# Patient Record
Sex: Female | Born: 1940 | Race: Black or African American | Hispanic: No | Marital: Married | Smoking: Former smoker
Health system: Southern US, Community
[De-identification: ages and names within clinical notes are randomized; demographics above are authoritative.]

## PROBLEM LIST (undated history)

## (undated) DIAGNOSIS — I10 Essential (primary) hypertension: Secondary | ICD-10-CM

## (undated) HISTORY — DX: Essential (primary) hypertension: I10

## (undated) HISTORY — PX: TONSILECTOMY, ADENOIDECTOMY, BILATERAL MYRINGOTOMY AND TUBES: SHX2538

## (undated) HISTORY — PX: ABDOMINAL HYSTERECTOMY: SHX81

---

## 1998-04-21 ENCOUNTER — Ambulatory Visit (HOSPITAL_COMMUNITY): Admission: RE | Admit: 1998-04-21 | Discharge: 1998-04-21 | Payer: Self-pay | Admitting: Internal Medicine

## 1999-07-08 ENCOUNTER — Other Ambulatory Visit: Admission: RE | Admit: 1999-07-08 | Discharge: 1999-07-08 | Payer: Self-pay | Admitting: Obstetrics and Gynecology

## 2000-04-06 ENCOUNTER — Ambulatory Visit (HOSPITAL_COMMUNITY): Admission: RE | Admit: 2000-04-06 | Discharge: 2000-04-06 | Payer: Self-pay | Admitting: Internal Medicine

## 2000-11-02 ENCOUNTER — Other Ambulatory Visit: Admission: RE | Admit: 2000-11-02 | Discharge: 2000-11-02 | Payer: Self-pay | Admitting: Obstetrics and Gynecology

## 2001-10-13 ENCOUNTER — Encounter: Payer: Self-pay | Admitting: Obstetrics and Gynecology

## 2001-10-14 ENCOUNTER — Inpatient Hospital Stay (HOSPITAL_COMMUNITY): Admission: RE | Admit: 2001-10-14 | Discharge: 2001-10-15 | Payer: Self-pay | Admitting: Obstetrics and Gynecology

## 2002-05-10 ENCOUNTER — Encounter: Admission: RE | Admit: 2002-05-10 | Discharge: 2002-08-08 | Payer: Self-pay | Admitting: Internal Medicine

## 2003-11-23 HISTORY — PX: LAPAROSCOPIC BILATERAL SALPINGO OOPHERECTOMY: SHX5890

## 2005-04-30 ENCOUNTER — Ambulatory Visit (HOSPITAL_COMMUNITY): Admission: RE | Admit: 2005-04-30 | Discharge: 2005-04-30 | Payer: Self-pay | Admitting: Gastroenterology

## 2006-09-05 ENCOUNTER — Encounter: Admission: RE | Admit: 2006-09-05 | Discharge: 2006-09-05 | Payer: Self-pay | Admitting: Internal Medicine

## 2010-12-13 ENCOUNTER — Encounter: Payer: Self-pay | Admitting: Internal Medicine

## 2013-08-01 ENCOUNTER — Ambulatory Visit (INDEPENDENT_AMBULATORY_CARE_PROVIDER_SITE_OTHER): Payer: Medicare Other | Admitting: Physician Assistant

## 2013-08-01 ENCOUNTER — Encounter: Payer: Self-pay | Admitting: Physician Assistant

## 2013-08-01 VITALS — BP 156/72 | HR 86 | Wt 159.0 lb

## 2013-08-01 DIAGNOSIS — G4700 Insomnia, unspecified: Secondary | ICD-10-CM

## 2013-08-01 DIAGNOSIS — R05 Cough: Secondary | ICD-10-CM

## 2013-08-01 DIAGNOSIS — Z13220 Encounter for screening for lipoid disorders: Secondary | ICD-10-CM

## 2013-08-01 DIAGNOSIS — I10 Essential (primary) hypertension: Secondary | ICD-10-CM

## 2013-08-01 DIAGNOSIS — Z23 Encounter for immunization: Secondary | ICD-10-CM

## 2013-08-01 DIAGNOSIS — Z131 Encounter for screening for diabetes mellitus: Secondary | ICD-10-CM

## 2013-08-01 MED ORDER — LOSARTAN POTASSIUM-HCTZ 100-25 MG PO TABS: 1.0000 | ORAL_TABLET | Freq: Every day | ORAL | Status: DC

## 2013-08-01 MED ORDER — AMLODIPINE BESYLATE 10 MG PO TABS: 10.0000 mg | ORAL_TABLET | Freq: Every day | ORAL | Status: DC

## 2013-08-01 NOTE — Progress Notes (Signed)
Subjective:    Patient ID: Shirley James, female    DOB: February 16, 1941, 72 y.o.   MRN: [id number]  HPI Patient is a 72 year old female who presents to the clinic to establish care. Past medical history positive for hypertension. She has been told in the past that she has diabetes but she is checked her sugar over the past year and reports that fasting has been running in the 90s. She would like clarification today. She is a former smoker that stopped in 1963. She exercises regularly in the form of walking. She has had a hysterectomy and nephrectomy.  Hypertension-blood pressure has been previously control. She has been out of Diovan for the last week. She would like to try something cheaper there is an option to. Patient was switched to Diovan after she developed a cough with an ACE inhibitor. Patient reports that cough has not gone away since. She denies any wheezing or shortness of breath. Cough is mostly present at night. She does not wake up short of breath or have to sleep on multiple pillows. Patient wonders if anything can be done for her cough. She denies any fever, chills, nausea, vomiting.  Patient is also having problems waking up at night and not been noted to sleep. She has no problems going to sleep but does wake up about 3:00 in the morning and not been able to go back to sleep. She is able to stay awake during the day and then usually sleeps very well the next night. She denies any significant snoring. She has not tried anything to make better.  Patient was told at one point she had diabetes. She would like rechecked. She has had her pneumonia shot in 2008. Mammogram is up-to-date as far as this year. She does have a left breast cyst that has been on.      Review of Systems  All other systems reviewed and are negative.       Objective:   Physical Exam  Constitutional: She is oriented to person, place, and time. She appears well-developed and well-nourished.  HENT:  Head:  Normocephalic and atraumatic.  Right Ear: External ear normal.  Left Ear: External ear normal.  Nose: Nose normal.  Mouth/Throat: Oropharynx is clear and moist.  Eyes: Conjunctivae are normal.  Neck: Normal range of motion. Neck supple.  Cardiovascular: Normal rate, regular rhythm and normal heart sounds.   Pulmonary/Chest: Effort normal and breath sounds normal. She has no wheezes.  Neurological: She is alert and oriented to person, place, and time.  Skin: Skin is warm and dry.  Psychiatric: She has a normal mood and affect. Her behavior is normal.          Assessment & Plan:  Hypertension-to change Diovan HCT to Hyzaar to see if cheaper work as well. Patient's BP was elevated but has not been on Diovan for last week. We'll recheck blood pressure in one to 2 months. Continue exercising and keeping a low salt diet. Continue on Norvasc.  Cough-caution to be present with ARB. There potentially could be an acid reflux calls or post nasal drip. Discussed with patient to try Flonase and Zyrtec daily. She could also consider a decongestant for the next couple of days. If not improving she should try Prilosec OTC to see if there could be some acid reflux component. If still having coughing if she is followup in office so we could consider inhalers or peak flows.   Screening for diabetes-we'll get fasting glucose. If elevated then  come followup with A1c.  Insomnia-discuss trying melatonin and good bedtime routine. Followup as needed.  Screening for cholesterol was also ordered.  Patient was given immunization for the flu.

## 2013-08-01 NOTE — Patient Instructions (Addendum)
Beta glucan 1 capsule daily. Zyrtec daily.  Then if not improved try Prilosec OTC.   Then call office.   Melatonin 3mg  to 10mg .

## 2013-08-03 DIAGNOSIS — G4700 Insomnia, unspecified: Secondary | ICD-10-CM | POA: Insufficient documentation

## 2013-08-03 DIAGNOSIS — I10 Essential (primary) hypertension: Secondary | ICD-10-CM | POA: Insufficient documentation

## 2013-08-03 LAB — LIPID PANEL
Cholesterol: 196 mg/dL (ref 0–200)
HDL: 68 mg/dL (ref >39)
LDL Cholesterol: 117 mg/dL — ABNORMAL HIGH (ref 0–99)
Total CHOL/HDL Ratio: 2.9 Ratio
Triglycerides: 56 mg/dL (ref <150)
VLDL: 11 mg/dL (ref 0–40)

## 2013-08-03 LAB — COMPLETE METABOLIC PANEL WITH GFR
ALT: 12 U/L (ref 0–35)
AST: 15 U/L (ref 0–37)
Albumin: 4.1 g/dL (ref 3.5–5.2)
Alkaline Phosphatase: 73 U/L (ref 39–117)
BUN: 16 mg/dL (ref 6–23)
CO2: 30 mEq/L (ref 19–32)
Calcium: 9 mg/dL (ref 8.4–10.5)
Chloride: 103 mEq/L (ref 96–112)
Creat: 0.83 mg/dL (ref 0.50–1.10)
GFR, Est African American: 82 mL/min
GFR, Est Non African American: 71 mL/min
Glucose, Bld: 97 mg/dL (ref 70–99)
Potassium: 3.8 mEq/L (ref 3.5–5.3)
Sodium: 140 mEq/L (ref 135–145)
Total Bilirubin: 0.5 mg/dL (ref 0.3–1.2)
Total Protein: 7.2 g/dL (ref 6.0–8.3)

## 2013-10-31 ENCOUNTER — Ambulatory Visit: Payer: Self-pay | Admitting: Physician Assistant

## 2013-11-07 ENCOUNTER — Ambulatory Visit (INDEPENDENT_AMBULATORY_CARE_PROVIDER_SITE_OTHER): Payer: Medicare Other | Admitting: Physician Assistant

## 2013-11-07 ENCOUNTER — Encounter: Payer: Self-pay | Admitting: Physician Assistant

## 2013-11-07 VITALS — BP 131/67 | HR 76 | Wt 161.0 lb

## 2013-11-07 DIAGNOSIS — I10 Essential (primary) hypertension: Secondary | ICD-10-CM

## 2013-11-07 DIAGNOSIS — G4700 Insomnia, unspecified: Secondary | ICD-10-CM

## 2013-11-07 DIAGNOSIS — R0982 Postnasal drip: Secondary | ICD-10-CM

## 2013-11-07 MED ORDER — AMLODIPINE BESYLATE 10 MG PO TABS: 10.0000 mg | ORAL_TABLET | Freq: Every day | ORAL | Status: DC

## 2013-11-07 MED ORDER — LOSARTAN POTASSIUM-HCTZ 100-25 MG PO TABS: 1.0000 | ORAL_TABLET | Freq: Every day | ORAL | Status: DC

## 2013-11-07 NOTE — Progress Notes (Signed)
Subjective:    Patient ID: Shirley James, female    DOB: June 09, 1941, 72 y.o.   MRN: [id number]  HPI Pt presents to the clinic to get medication refills.   HTN- taking medication regularly. Denies any CP, palpitations, headaches, vision changes. Feels great. Exercises regularly. Keeping a low salt diet.   Insomnia- ongoing never tried melatonin.   PND- cough is much better with zyrtec. No complaints.    Review of Systems     Objective:   Physical Exam  Constitutional: She is oriented to person, place, and time. She appears well-developed and well-nourished.  HENT:  Head: Normocephalic and atraumatic.  Cardiovascular: Normal rate, regular rhythm and normal heart sounds.   Pulmonary/Chest: Effort normal and breath sounds normal. She has no wheezes.  Neurological: She is alert and oriented to person, place, and time.  Skin: Skin is warm and dry.  Psychiatric: She has a normal mood and affect. Her behavior is normal.          Assessment & Plan:  HTN-BP looks good today. Refilled for 6 months. Will get Bloodwork in 6 months.   Insomnia- Not tried melatonin. Discussed 3mg  up to 10mg  1 hour before bedtime. Follow up as needed.    PND- controlled with zyrtec as needed. Discussed with pt sudafed for a couple of days if PND worsening could also help.

## 2014-05-08 ENCOUNTER — Ambulatory Visit: Payer: Self-pay | Admitting: Physician Assistant

## 2014-05-13 ENCOUNTER — Encounter: Payer: Self-pay | Admitting: Physician Assistant

## 2014-05-13 ENCOUNTER — Ambulatory Visit (INDEPENDENT_AMBULATORY_CARE_PROVIDER_SITE_OTHER): Payer: Medicare Other | Admitting: Physician Assistant

## 2014-05-13 VITALS — BP 122/68 | HR 76 | Wt 158.0 lb

## 2014-05-13 DIAGNOSIS — R011 Cardiac murmur, unspecified: Secondary | ICD-10-CM

## 2014-05-13 DIAGNOSIS — Z23 Encounter for immunization: Secondary | ICD-10-CM

## 2014-05-13 DIAGNOSIS — I10 Essential (primary) hypertension: Secondary | ICD-10-CM

## 2014-05-13 MED ORDER — AMLODIPINE BESYLATE 10 MG PO TABS: 10.0000 mg | ORAL_TABLET | Freq: Every day | ORAL | Status: DC

## 2014-05-13 MED ORDER — LOSARTAN POTASSIUM-HCTZ 100-25 MG PO TABS: 1.0000 | ORAL_TABLET | Freq: Every day | ORAL | Status: DC

## 2014-05-13 NOTE — Patient Instructions (Signed)

## 2014-05-13 NOTE — Progress Notes (Addendum)
Subjective:    Patient ID: Shirley James, female    DOB: 02/06/1941, 73 y.o.   MRN: [id number]  HPI Pt is a 73 yo female who presents to the clinic for 6 month follow up on HTN. Doing great. No concerns or complaints. Denies any HA's, vision changes, CP or palpitations. Exercises on treadmill at home daily. Feels great.    Review of Systems  All other systems reviewed and are negative.      Objective:   Physical Exam  Constitutional: She is oriented to person, place, and time. She appears well-developed and well-nourished.  HENT:  Head: Normocephalic and atraumatic.  Cardiovascular: Normal rate and regular rhythm.   2/6 systolic murmur-ongoing hx of.   Pulmonary/Chest: Effort normal and breath sounds normal. She has no wheezes.  Neurological: She is alert and oriented to person, place, and time.  Skin: Skin is dry.  Psychiatric: She has a normal mood and affect. Her behavior is normal.          Assessment & Plan:  HTN- refilled norvasc and hyzaar for 6 months.   Heart murmur- hx of. Discussed with pt need for echo to reevaluate in next year. Discussed if dizziness, weakness, SOB etc come in for follow up.   prevnar 13 given today. Will follow up with pneumococcal 23 in one year.   Last labs 07/2013. Will wait until December to get fastings labs.   Will call and try to get copy of last colonsocopy to determine when she is due for next one.

## 2014-07-30 ENCOUNTER — Encounter: Payer: Self-pay | Admitting: Physician Assistant

## 2014-11-05 ENCOUNTER — Encounter: Payer: Self-pay | Admitting: Physician Assistant

## 2014-11-05 ENCOUNTER — Ambulatory Visit (INDEPENDENT_AMBULATORY_CARE_PROVIDER_SITE_OTHER): Payer: Medicare Other | Admitting: Physician Assistant

## 2014-11-05 VITALS — BP 127/60 | HR 80 | Wt 159.0 lb

## 2014-11-05 DIAGNOSIS — G5601 Carpal tunnel syndrome, right upper limb: Secondary | ICD-10-CM

## 2014-11-05 DIAGNOSIS — R011 Cardiac murmur, unspecified: Secondary | ICD-10-CM

## 2014-11-05 DIAGNOSIS — Z23 Encounter for immunization: Secondary | ICD-10-CM

## 2014-11-05 DIAGNOSIS — I10 Essential (primary) hypertension: Secondary | ICD-10-CM

## 2014-11-05 MED ORDER — AMLODIPINE BESYLATE 10 MG PO TABS: 10.0000 mg | ORAL_TABLET | Freq: Every day | ORAL | Status: DC

## 2014-11-05 MED ORDER — LOSARTAN POTASSIUM-HCTZ 100-25 MG PO TABS: 1.0000 | ORAL_TABLET | Freq: Every day | ORAL | Status: DC

## 2014-11-05 NOTE — Progress Notes (Signed)
Subjective:    Patient ID: Shirley James, female    DOB: 11-04-1941, 73 y.o.   MRN: [id number]  HPI Pt presents to the clinic for medication refill.   HTN- doing well. No concerns. No CP, palpitations, SOB, headaches. Taking medication daily.   Pt does reports some right sided hand and wrist numbness and tingling at night and in the morning. Not every night just usually once or twice a month. Goes away once been up a little bit.    Review of Systems  All other systems reviewed and are negative.      Objective:   Physical Exam  Constitutional: She is oriented to person, place, and time. She appears well-developed and well-nourished.  HENT:  Head: Normocephalic and atraumatic.  Cardiovascular: Normal rate and regular rhythm.   Murmur heard. SEM 4/6  Pulmonary/Chest: Effort normal and breath sounds normal. She has no wheezes.  Musculoskeletal:  Normal ROm or right wrist.  Negative phalens and tinels.  No tendernss to palpation over right hand.  No thenar emience atropy.   Neurological: She is alert and oriented to person, place, and time.  Skin: Skin is dry.  Psychiatric: She has a normal mood and affect. Her behavior is normal.          Assessment & Plan:  HTN- refilled for 9 months.   Needs CPE.   Systolic heart murmur- unchanged, not symptomatic. Needs follow up echo pt reports will get next year.   Right wrist carpel tunnel syndrome- reassured pt sounds like carpel tunnel. Symptoms not very consistent. Suggested night brace at [facility] and ibuprofen as needed. If symptoms occuring more often follow up for more treatment.

## 2015-03-21 ENCOUNTER — Ambulatory Visit (INDEPENDENT_AMBULATORY_CARE_PROVIDER_SITE_OTHER): Payer: Self-pay | Admitting: Physician Assistant

## 2015-03-21 ENCOUNTER — Encounter: Payer: Self-pay | Admitting: Physician Assistant

## 2015-03-21 VITALS — BP 145/67 | HR 93 | Wt 161.0 lb

## 2015-03-21 DIAGNOSIS — E559 Vitamin D deficiency, unspecified: Secondary | ICD-10-CM | POA: Diagnosis not present

## 2015-03-21 DIAGNOSIS — R5383 Other fatigue: Secondary | ICD-10-CM

## 2015-03-21 DIAGNOSIS — Z79899 Other long term (current) drug therapy: Secondary | ICD-10-CM | POA: Diagnosis not present

## 2015-03-21 DIAGNOSIS — G4700 Insomnia, unspecified: Secondary | ICD-10-CM

## 2015-03-21 LAB — TSH: TSH: 1.106 u[IU]/mL (ref 0.350–4.500)

## 2015-03-21 LAB — VITAMIN B12: VITAMIN B 12: 332 pg/mL (ref 211–911)

## 2015-03-21 MED ORDER — TRAZODONE HCL 50 MG PO TABS: 25.0000 mg | ORAL_TABLET | Freq: Every evening | ORAL | Status: DC | PRN

## 2015-03-21 NOTE — Patient Instructions (Addendum)

## 2015-03-22 LAB — VITAMIN D 25 HYDROXY (VIT D DEFICIENCY, FRACTURES): Vit D, 25-Hydroxy: 12 ng/mL — ABNORMAL LOW (ref 30–100)

## 2015-03-23 ENCOUNTER — Other Ambulatory Visit: Payer: Self-pay | Admitting: Physician Assistant

## 2015-03-23 ENCOUNTER — Encounter: Payer: Self-pay | Admitting: Physician Assistant

## 2015-03-23 DIAGNOSIS — R5383 Other fatigue: Secondary | ICD-10-CM | POA: Insufficient documentation

## 2015-03-23 DIAGNOSIS — E559 Vitamin D deficiency, unspecified: Secondary | ICD-10-CM | POA: Insufficient documentation

## 2015-03-23 DIAGNOSIS — E538 Deficiency of other specified B group vitamins: Secondary | ICD-10-CM | POA: Insufficient documentation

## 2015-03-23 MED ORDER — VITAMIN D (ERGOCALCIFEROL) 1.25 MG (50000 UNIT) PO CAPS: 50000.0000 [IU] | ORAL_CAPSULE | ORAL | Status: DC

## 2015-03-23 NOTE — Progress Notes (Signed)
Stated unable to route please look under labs and discuss results with patient. Concerning b12 and vitamin d levels.

## 2015-03-23 NOTE — Progress Notes (Addendum)
Subjective:    Patient ID: Shirley James, female    DOB: [birth year], 74 y.o.   MRN: [id number]  HPI Pt presents to the clinic complaining of fatigue and insomnia for past 2 months. She admits she is not sleeping well. Her mother recently died and handling affairs in a different state. She feels mentally and physically overwhelmed. Not done anything to make better. Just feels like fatigue is getting worse. No SOB, CP, swelling of extremities.   Pt did have follow up with cardiologist and labs were drawn HgB was 12.9, liver enzymes good, potassium low but per doctor in hx and stable.    Review of Systems  All other systems reviewed and are negative.      Objective:   Physical Exam  Constitutional: She is oriented to person, place, and time. She appears well-nourished.  HENT:  Head: Normocephalic and atraumatic.  Cardiovascular: Normal rate and regular rhythm.   Systolic murmur 2/6.   Pulmonary/Chest: Effort normal and breath sounds normal. She has no wheezes.  Neurological: She is alert and oriented to person, place, and time.  Skin: Skin is dry.  Psychiatric: She has a normal mood and affect. Her behavior is normal.          Assessment & Plan:  Fatigue/insomnia- I do think some of her mental stress with deaths in family and handling affairs has contributed to fatigue. Discussed rest and importance of sleep. Not sleeping well can also cause problems. Start with melatonin 3mg  up to 10mg  before bed. If not improving then can try trazodone at bedtime. Start with 1/2 tablet then increase to 1 tablet. Will order TSH, vitamin D, b12. Follow up in 1-2 months with symptoms or sooner if needed. Certainly if stress, anxiety, depression become a problem then follow up to discuss other options.

## 2015-03-24 NOTE — Progress Notes (Signed)
Pt notified of results & rx.

## 2015-05-17 ENCOUNTER — Other Ambulatory Visit: Payer: Self-pay | Admitting: Physician Assistant

## 2015-08-14 DIAGNOSIS — M79645 Pain in left finger(s): Secondary | ICD-10-CM | POA: Diagnosis not present

## 2015-11-16 ENCOUNTER — Other Ambulatory Visit: Payer: Self-pay | Admitting: Physician Assistant

## 2015-12-02 ENCOUNTER — Ambulatory Visit (INDEPENDENT_AMBULATORY_CARE_PROVIDER_SITE_OTHER): Payer: Medicare Other | Admitting: Physician Assistant

## 2015-12-02 ENCOUNTER — Encounter: Payer: Self-pay | Admitting: Physician Assistant

## 2015-12-02 VITALS — BP 138/56 | HR 70 | Ht 64.0 in | Wt 157.0 lb

## 2015-12-02 DIAGNOSIS — G4700 Insomnia, unspecified: Secondary | ICD-10-CM | POA: Diagnosis not present

## 2015-12-02 DIAGNOSIS — R011 Cardiac murmur, unspecified: Secondary | ICD-10-CM | POA: Diagnosis not present

## 2015-12-02 DIAGNOSIS — I10 Essential (primary) hypertension: Secondary | ICD-10-CM | POA: Diagnosis not present

## 2015-12-02 DIAGNOSIS — Z23 Encounter for immunization: Secondary | ICD-10-CM | POA: Diagnosis not present

## 2015-12-02 DIAGNOSIS — Z1239 Encounter for other screening for malignant neoplasm of breast: Secondary | ICD-10-CM

## 2015-12-02 DIAGNOSIS — Z13220 Encounter for screening for lipoid disorders: Secondary | ICD-10-CM

## 2015-12-02 DIAGNOSIS — Z13820 Encounter for screening for osteoporosis: Secondary | ICD-10-CM

## 2015-12-02 DIAGNOSIS — Z131 Encounter for screening for diabetes mellitus: Secondary | ICD-10-CM

## 2015-12-02 MED ORDER — AMLODIPINE BESYLATE 10 MG PO TABS: 10.0000 mg | ORAL_TABLET | Freq: Every day | ORAL | Status: DC

## 2015-12-02 MED ORDER — LOSARTAN POTASSIUM-HCTZ 100-25 MG PO TABS: 1.0000 | ORAL_TABLET | Freq: Every day | ORAL | Status: DC

## 2015-12-02 NOTE — Progress Notes (Signed)
Subjective:    Patient ID: Shirley James, female    DOB: 08-29-1941, 75 y.o.   MRN: [id number]  HPI  Pt is a 75 yo female who presents to the clinic for 6 month follow up.   HTN- doing well. No problems or concerns. No CP, palpitations, headaches, dizziness. Taking medication daily.   Insomnia- well controlled as needed melatonin.   Review of Systems  All other systems reviewed and are negative.      Objective:   Physical Exam  Constitutional: She is oriented to person, place, and time. She appears well-developed and well-nourished.  HENT:  Head: Normocephalic and atraumatic.  Neck: Normal range of motion. Neck supple.  Cardiovascular: Normal rate and regular rhythm.   2/6 SEM, stable.   Pulmonary/Chest: Effort normal and breath sounds normal. She has no wheezes.  Neurological: She is alert and oriented to person, place, and time.  Psychiatric: She has a normal mood and affect. Her behavior is normal.          Assessment & Plan:  HTN- controlled. Refilled for 6 months.   Systolic ejection murmur- stable. Due to follow up echo. Will order.   Insomnia- controlled with melatonin.   Lipid and cmp for screening.   Flu shot given today.   Discussed need for pneumovax 23. Per pt states she has had. Will call Dr. Murtis Sink to see if can get record of this.   Need for colonoscopy records. Per pt feels like 8 years ago. Had done at Dr. Ida Rogue will call and try to get report.   Mammogram and bone density ordered today.

## 2015-12-10 ENCOUNTER — Ambulatory Visit (HOSPITAL_BASED_OUTPATIENT_CLINIC_OR_DEPARTMENT_OTHER)
Admission: RE | Admit: 2015-12-10 | Discharge: 2015-12-10 | Disposition: A | Discharge status: Home / Self Care | Payer: Medicare Other | Source: Ambulatory Visit | Attending: Physician Assistant | Admitting: Physician Assistant

## 2015-12-10 DIAGNOSIS — I10 Essential (primary) hypertension: Secondary | ICD-10-CM | POA: Diagnosis not present

## 2015-12-10 DIAGNOSIS — R011 Cardiac murmur, unspecified: Secondary | ICD-10-CM

## 2015-12-10 DIAGNOSIS — I351 Nonrheumatic aortic (valve) insufficiency: Secondary | ICD-10-CM | POA: Insufficient documentation

## 2015-12-10 DIAGNOSIS — Z87891 Personal history of nicotine dependence: Secondary | ICD-10-CM | POA: Diagnosis not present

## 2015-12-10 NOTE — Progress Notes (Signed)
  Echocardiogram 2D Echocardiogram has been performed.  Jennette Dubin 12/10/2015, 2:31 PM

## 2015-12-18 ENCOUNTER — Ambulatory Visit (INDEPENDENT_AMBULATORY_CARE_PROVIDER_SITE_OTHER): Payer: Medicare Other

## 2015-12-18 DIAGNOSIS — Z13820 Encounter for screening for osteoporosis: Secondary | ICD-10-CM

## 2015-12-18 DIAGNOSIS — Z1231 Encounter for screening mammogram for malignant neoplasm of breast: Secondary | ICD-10-CM | POA: Diagnosis not present

## 2016-03-16 ENCOUNTER — Encounter: Payer: Self-pay | Admitting: Physician Assistant

## 2016-03-16 ENCOUNTER — Ambulatory Visit (INDEPENDENT_AMBULATORY_CARE_PROVIDER_SITE_OTHER): Payer: Medicare Other | Admitting: Physician Assistant

## 2016-03-16 VITALS — BP 148/51 | HR 79 | Temp 98.1°F | Ht 64.0 in | Wt 154.0 lb

## 2016-03-16 DIAGNOSIS — R1012 Left upper quadrant pain: Secondary | ICD-10-CM | POA: Diagnosis not present

## 2016-03-16 DIAGNOSIS — R1013 Epigastric pain: Secondary | ICD-10-CM | POA: Diagnosis not present

## 2016-03-16 DIAGNOSIS — R197 Diarrhea, unspecified: Secondary | ICD-10-CM | POA: Diagnosis not present

## 2016-03-16 DIAGNOSIS — G43A0 Cyclical vomiting, not intractable: Secondary | ICD-10-CM

## 2016-03-16 DIAGNOSIS — R1115 Cyclical vomiting syndrome unrelated to migraine: Secondary | ICD-10-CM

## 2016-03-16 MED ORDER — SUCRALFATE 1 G PO TABS: 1.0000 g | ORAL_TABLET | Freq: Two times a day (BID) | ORAL | Status: DC

## 2016-03-16 MED ORDER — ONDANSETRON HCL 4 MG PO TABS: 4.0000 mg | ORAL_TABLET | Freq: Three times a day (TID) | ORAL | Status: DC | PRN

## 2016-03-16 MED ORDER — OMEPRAZOLE 40 MG PO CPDR: 40.0000 mg | DELAYED_RELEASE_CAPSULE | Freq: Every day | ORAL | Status: DC

## 2016-03-16 NOTE — Patient Instructions (Signed)
Peptic Ulcer ° °A peptic ulcer is a sore in the lining of your esophagus (esophageal ulcer), stomach (gastric ulcer), or in the first part of your small intestine (duodenal ulcer). The ulcer causes erosion into the deeper tissue. °CAUSES  °Normally, the lining of the stomach and the small intestine protects itself from the acid that digests food. The protective lining can be damaged by: °· An infection caused by a bacterium called Helicobacter pylori (H. pylori). °· Regular use of nonsteroidal anti-inflammatory drugs (NSAIDs), such as ibuprofen or aspirin. °· Smoking tobacco. °Other risk factors include being older than 50, drinking alcohol excessively, and having a family history of ulcer disease.  °SYMPTOMS  °· Burning pain or gnawing in the area between the chest and the belly button. °· Heartburn. °· Nausea and vomiting. °· Bloating. °The pain can be worse on an empty stomach and at night. If the ulcer results in bleeding, it can cause: °· Black, tarry stools. °· Vomiting of bright red blood. °· Vomiting of coffee-ground-looking materials. °DIAGNOSIS  °A diagnosis is usually made based upon your history and an exam. Other tests and procedures may be performed to find the cause of the ulcer. Finding a cause will help determine the best treatment. Tests and procedures may include: °· Blood tests, stool tests, or breath tests to check for the bacterium H. pylori. °· An upper gastrointestinal (GI) series of the esophagus, stomach, and small intestine. °· An endoscopy to examine the esophagus, stomach, and small intestine. °· A biopsy. °TREATMENT  °Treatment may include: °· Eliminating the cause of the ulcer, such as smoking, NSAIDs, or alcohol. °· Medicines to reduce the amount of acid in your digestive tract. °· Antibiotic medicines if the ulcer is caused by the H. pylori bacterium. °· An upper endoscopy to treat a bleeding ulcer. °· Surgery if the bleeding is severe or if the ulcer created a hole somewhere in the  digestive system. °HOME CARE INSTRUCTIONS  °· Avoid tobacco, alcohol, and caffeine. Smoking can increase the acid in the stomach, and continued smoking will impair the healing of ulcers. °· Avoid foods and drinks that seem to cause discomfort or aggravate your ulcer. °· Only take medicines as directed by your caregiver. Do not substitute over-the-counter medicines for prescription medicines without talking to your caregiver. °· Keep any follow-up appointments and tests as directed. °SEEK MEDICAL CARE IF:  °· Your do not improve within 7 days of starting treatment. °· You have ongoing indigestion or heartburn. °SEEK IMMEDIATE MEDICAL CARE IF:  °· You have sudden, sharp, or persistent abdominal pain. °· You have bloody or dark black, tarry stools. °· You vomit blood or vomit that looks like coffee grounds. °· You become light-headed, weak, or feel faint. °· You become sweaty or clammy. °MAKE SURE YOU:  °· Understand these instructions. °· Will watch your condition. °· Will get help right away if you are not doing well or get worse. °  °This information is not intended to replace advice given to you by your health care provider. Make sure you discuss any questions you have with your health care provider. °  °Document Released: 11/05/2000 Document Revised: 11/29/2014 Document Reviewed: 06/07/2012 °Elsevier Interactive Patient Education ©2016 [employer] ° °

## 2016-03-16 NOTE — Progress Notes (Signed)
Subjective:    Patient ID: Shirley James, female    DOB: Dec 27, 1940, 75 y.o.   MRN: [id number]  HPI  Pt is a 75 yo AA women who presents to the clinic with 1 week of upper abdomen burning/ripping pain, nausea, and diarrhea. 1 week ago suddenly in the middle of the night she woke up with upper abdomen "ripping pain". She vomited a few times and had a few episodes of loose stool. It improved next morning and now continues to have diarrhea a few times a day and nausea feeling with dull burn. Denies any melena or hematachezia. She has kept to bland diet for a week with water, noodles, bread. Nothing really makes worse. No fever, chills, body aches. No urinary frequency, urgency, dysuria. She has not been taking NSAIDs. She has been under a tremendous amount of stress with brother and settling her mothers estate.    Review of Systems  All other systems reviewed and are negative.      Objective:   Physical Exam  Constitutional: She is oriented to person, place, and time. She appears well-developed and well-nourished.  HENT:  Head: Normocephalic and atraumatic.  Neck: Normal range of motion. Neck supple.  Cardiovascular: Normal rate, regular rhythm and normal heart sounds.   Pulmonary/Chest: Effort normal and breath sounds normal.  NO CVA tenderness.   Abdominal: Soft. Bowel sounds are normal. She exhibits no distension and no mass. There is tenderness. There is no rebound and no guarding.  No guarding or rebound tenderness.  Negative for murphys sign.  Tenderness over epigastric and LUQ. Worse of over epigastric area.   Neurological: She is alert and oriented to person, place, and time.  Skin: Skin is dry.  Psychiatric: She has a normal mood and affect. Her behavior is normal.          Assessment & Plan:  Epigastric pain/LUQ pain/nausea/diarrhea- suspect PUD. Like due to stress. She denies any recent NSAID use. Will get h.pylori breath test today. Start omeprazole 40mg  daily and  carafate bid for 2 months. Will call if this changes. Most ulcers heal within 6-8 weeks. Follow up or call ig symptoms worsening or not improving in next 7-10 days. zofran given for nausea. Cbc, cmp, lipase ordered.

## 2016-03-17 ENCOUNTER — Encounter: Payer: Self-pay | Admitting: Physician Assistant

## 2016-03-17 ENCOUNTER — Other Ambulatory Visit: Payer: Self-pay | Admitting: *Deleted

## 2016-03-17 DIAGNOSIS — A048 Other specified bacterial intestinal infections: Secondary | ICD-10-CM | POA: Insufficient documentation

## 2016-03-17 LAB — H. PYLORI BREATH TEST: H. pylori Breath Test: DETECTED — AB

## 2016-03-17 MED ORDER — CLARITHROMYCIN 500 MG PO TABS: 500.0000 mg | ORAL_TABLET | Freq: Two times a day (BID) | ORAL | Status: DC

## 2016-03-17 MED ORDER — METRONIDAZOLE 500 MG PO TABS: 500.0000 mg | ORAL_TABLET | Freq: Two times a day (BID) | ORAL | Status: DC

## 2016-03-18 ENCOUNTER — Telehealth: Payer: Self-pay

## 2016-03-18 ENCOUNTER — Telehealth: Payer: Self-pay | Admitting: *Deleted

## 2016-03-19 ENCOUNTER — Other Ambulatory Visit: Payer: Self-pay | Admitting: Physician Assistant

## 2016-03-19 NOTE — Telephone Encounter (Signed)
Notified pharmacy of only taking 1/2 dose of norvasc while on biaxin. Also spoke with patient and she verbalized understanding of taking 1/2 dose of norvasc while on biaxin, and avoiding the zofran until completed.

## 2016-03-19 NOTE — Telephone Encounter (Signed)
Pt can cut norvasc in half while on biaxin and then avoid zofran.

## 2016-04-20 DIAGNOSIS — H25813 Combined forms of age-related cataract, bilateral: Secondary | ICD-10-CM | POA: Diagnosis not present

## 2016-04-20 DIAGNOSIS — H527 Unspecified disorder of refraction: Secondary | ICD-10-CM | POA: Diagnosis not present

## 2016-04-20 DIAGNOSIS — H43813 Vitreous degeneration, bilateral: Secondary | ICD-10-CM | POA: Diagnosis not present

## 2016-05-31 ENCOUNTER — Encounter: Payer: Self-pay | Admitting: Physician Assistant

## 2016-05-31 ENCOUNTER — Ambulatory Visit (INDEPENDENT_AMBULATORY_CARE_PROVIDER_SITE_OTHER): Payer: Medicare Other | Admitting: Physician Assistant

## 2016-05-31 VITALS — BP 128/82 | HR 69 | Wt 147.0 lb

## 2016-05-31 DIAGNOSIS — R011 Cardiac murmur, unspecified: Secondary | ICD-10-CM

## 2016-05-31 DIAGNOSIS — Z23 Encounter for immunization: Secondary | ICD-10-CM | POA: Diagnosis not present

## 2016-05-31 DIAGNOSIS — Z13220 Encounter for screening for lipoid disorders: Secondary | ICD-10-CM | POA: Diagnosis not present

## 2016-05-31 DIAGNOSIS — I10 Essential (primary) hypertension: Secondary | ICD-10-CM | POA: Diagnosis not present

## 2016-05-31 DIAGNOSIS — Z0000 Encounter for general adult medical examination without abnormal findings: Secondary | ICD-10-CM

## 2016-05-31 DIAGNOSIS — Z1211 Encounter for screening for malignant neoplasm of colon: Secondary | ICD-10-CM

## 2016-05-31 DIAGNOSIS — Z131 Encounter for screening for diabetes mellitus: Secondary | ICD-10-CM

## 2016-05-31 MED ORDER — LOSARTAN POTASSIUM-HCTZ 100-25 MG PO TABS: 1.0000 | ORAL_TABLET | Freq: Every day | ORAL | Status: DC

## 2016-05-31 MED ORDER — AMLODIPINE BESYLATE 10 MG PO TABS: 10.0000 mg | ORAL_TABLET | Freq: Every day | ORAL | Status: DC

## 2016-05-31 NOTE — Patient Instructions (Signed)
Start ASA 81mg .  Place order for colonoscopy.

## 2016-05-31 NOTE — Progress Notes (Signed)
Subjective:    Patient ID: Shirley James, female    DOB: 1941-09-11, 75 y.o.   MRN: [id number]  HPI Pt is a 75 yo female who presents to the clinic to follow up on HTN. No problems or concerns. Checks BP monthly with her visit in an azlheimer study that she is doing. No CP, palpitations, headaches or dizziness. Taking medications daily. She is staying active.   She does report on a health screening from her insurance positive hemoccult. She was taking medication for treatment of an gastric ulcer at that time. No melena or hematochezia.    Review of Systems  All other systems reviewed and are negative.      Objective:   Physical Exam  Constitutional: She is oriented to person, place, and time. She appears well-developed and well-nourished.  HENT:  Head: Normocephalic and atraumatic.  Right Ear: External ear normal.  Left Ear: External ear normal.  Nose: Nose normal.  Mouth/Throat: Oropharynx is clear and moist. No oropharyngeal exudate.  Eyes: Conjunctivae are normal.  Neck: Normal range of motion. Neck supple.  Cardiovascular: Normal rate and regular rhythm.   Systolic heart murmur.   Pulmonary/Chest: Effort normal and breath sounds normal.  Lymphadenopathy:    She has no cervical adenopathy.  Neurological: She is alert and oriented to person, place, and time.  Skin: Skin is dry.  Psychiatric: She has a normal mood and affect. Her behavior is normal.          Assessment & Plan:  HTN- 2nd recheck looks great. Refilled norvasc and hyzaar for 6 months. Encouraged to start ASA 81mg  daily.   Positive hemoccults/colon cancer screening- needs colon cancer screening. Per pt return hemoccult and blood found in stool. She was being treated for an ulcer at that time. No visible blood. Referral made today.   pneumococcal 23 vaccine given today.   Lipid/cmp ordered today.

## 2016-05-31 NOTE — Addendum Note (Signed)
Addended by: Terance Hart on: 05/31/2016 05:19 PM   Modules accepted: Orders

## 2016-06-03 DIAGNOSIS — Z0000 Encounter for general adult medical examination without abnormal findings: Secondary | ICD-10-CM | POA: Diagnosis not present

## 2016-06-03 DIAGNOSIS — Z13220 Encounter for screening for lipoid disorders: Secondary | ICD-10-CM | POA: Diagnosis not present

## 2016-06-03 DIAGNOSIS — Z131 Encounter for screening for diabetes mellitus: Secondary | ICD-10-CM | POA: Diagnosis not present

## 2016-06-03 DIAGNOSIS — Z79899 Other long term (current) drug therapy: Secondary | ICD-10-CM | POA: Diagnosis not present

## 2016-06-04 LAB — CBC
HCT: 38.8 % (ref 35.0–45.0)
HEMOGLOBIN: 12.9 g/dL (ref 11.7–15.5)
MCH: 30.6 pg (ref 27.0–33.0)
MCHC: 33.2 g/dL (ref 32.0–36.0)
MCV: 92.2 fL (ref 80.0–100.0)
MPV: 8.7 fL (ref 7.5–12.5)
PLATELETS: 321 10*3/uL (ref 140–400)
RBC: 4.21 MIL/uL (ref 3.80–5.10)
RDW: 13.6 % (ref 11.0–15.0)
WBC: 3.9 10*3/uL (ref 3.8–10.8)

## 2016-06-04 LAB — COMPLETE METABOLIC PANEL WITH GFR
ALBUMIN: 4.2 g/dL (ref 3.6–5.1)
ALK PHOS: 72 U/L (ref 33–130)
ALT: 12 U/L (ref 6–29)
AST: 14 U/L (ref 10–35)
BILIRUBIN TOTAL: 0.5 mg/dL (ref 0.2–1.2)
BUN: 13 mg/dL (ref 7–25)
CO2: 29 mmol/L (ref 20–31)
Calcium: 9.1 mg/dL (ref 8.6–10.4)
Chloride: 101 mmol/L (ref 98–110)
Creat: 0.84 mg/dL (ref 0.60–0.93)
GFR, Est African American: 79 mL/min (ref >=60)
GFR, Est Non African American: 69 mL/min (ref >=60)
GLUCOSE: 98 mg/dL (ref 65–99)
Potassium: 3.6 mmol/L (ref 3.5–5.3)
SODIUM: 141 mmol/L (ref 135–146)
TOTAL PROTEIN: 6.8 g/dL (ref 6.1–8.1)

## 2016-06-04 LAB — LIPID PANEL
Cholesterol: 181 mg/dL (ref 125–200)
HDL: 73 mg/dL (ref >=46)
LDL CALC: 94 mg/dL (ref <130)
Total CHOL/HDL Ratio: 2.5 Ratio (ref <=5.0)
Triglycerides: 68 mg/dL (ref <150)
VLDL: 14 mg/dL (ref <30)

## 2016-07-23 DIAGNOSIS — Z1211 Encounter for screening for malignant neoplasm of colon: Secondary | ICD-10-CM | POA: Diagnosis not present

## 2016-07-23 DIAGNOSIS — D120 Benign neoplasm of cecum: Secondary | ICD-10-CM | POA: Diagnosis not present

## 2016-07-23 DIAGNOSIS — D123 Benign neoplasm of transverse colon: Secondary | ICD-10-CM | POA: Diagnosis not present

## 2016-07-23 LAB — HM COLONOSCOPY

## 2016-07-27 ENCOUNTER — Other Ambulatory Visit: Payer: Self-pay | Admitting: Physician Assistant

## 2016-07-29 ENCOUNTER — Encounter: Payer: Self-pay | Admitting: Physician Assistant

## 2016-12-03 ENCOUNTER — Encounter: Payer: Self-pay | Admitting: Physician Assistant

## 2016-12-03 ENCOUNTER — Ambulatory Visit (INDEPENDENT_AMBULATORY_CARE_PROVIDER_SITE_OTHER): Payer: Medicare Other | Admitting: Physician Assistant

## 2016-12-03 VITALS — BP 123/69 | HR 79 | Ht 64.0 in | Wt 153.0 lb

## 2016-12-03 DIAGNOSIS — I10 Essential (primary) hypertension: Secondary | ICD-10-CM | POA: Diagnosis not present

## 2016-12-03 NOTE — Progress Notes (Addendum)
Subjective:    Patient ID: Shirley James, female    DOB: 06-Jan-1941, 76 y.o.   MRN: [id number]  HPI  Patient is a 76yo female who presents to the clinic for a 6 month follow on hypertension.  Patient states she is taking her Norvasc and hyzaar as prescribed with no side effects.  Patient states she periodically checks her blood pressure and it is well controlled at home in the 120's over 70's.  Patient denies taking aspirin 81mg , but agrees to start one.  Patient has no concerns.      Review of Systems  All other systems reviewed and are negative.      Objective:   Physical Exam  Constitutional: She is oriented to person, place, and time. She appears well-developed and well-nourished.  HENT:  Head: Normocephalic and atraumatic.  Cardiovascular: Normal rate and regular rhythm.   Pulmonary/Chest: Effort normal and breath sounds normal.  Neurological: She is alert and oriented to person, place, and time.  Skin: Skin is warm and dry.  Psychiatric: She has a normal mood and affect. Her behavior is normal.          Assessment & Plan:  Marland KitchenMarland KitchenTrease was seen today for hypertension.  Diagnoses and all orders for this visit:  Essential hypertension, benign   Patient had no concerns and does not need refills at this time.  Patient encouraged to take aspirin 81mg  daily.   Follow-up in 6 months for routine labs.

## 2017-02-01 ENCOUNTER — Ambulatory Visit: Payer: Self-pay

## 2017-02-04 ENCOUNTER — Ambulatory Visit (INDEPENDENT_AMBULATORY_CARE_PROVIDER_SITE_OTHER): Payer: Medicare Other

## 2017-02-04 VITALS — BP 126/60 | HR 81 | Temp 97.6°F | Ht 64.0 in | Wt 152.1 lb

## 2017-02-04 DIAGNOSIS — Z0000 Encounter for general adult medical examination without abnormal findings: Secondary | ICD-10-CM

## 2017-02-04 NOTE — Patient Instructions (Addendum)
Shirley James , Thank you for taking time to come for your Medicare Wellness Visit. I appreciate your ongoing commitment to your health goals. Please review the following plan we discussed and let me know if I can assist you in the future.   Screening recommendations/referrals: Colonoscopy: Due 07/23/26 Mammogram: Due this Spring  Bone Density: Completed Recommended yearly ophthalmology/optometry visit for glaucoma screening and checkup Recommended yearly dental visit for hygiene and checkup  Vaccinations: Influenza vaccine: Due 06/03/17 Pneumococcal vaccine: Completed Tdap vaccine: Due 11/05/24 Shingles vaccine: Due Now  Advanced directives: Please bring a copy of your Healthcare POA/Living Will at your next appt.   Next appointment: Seeing Shirley James on 06/03/17 at 8:30 am.   Preventive Care 65 Years and Older, Female Preventive care refers to lifestyle choices and visits with your health care provider that can promote health and wellness. What does preventive care include?  A yearly physical exam. This is also called an annual well check.  Dental exams once or twice a year.  Routine eye exams. Ask your health care provider how often you should have your eyes checked.  Personal lifestyle choices, including:  Daily care of your teeth and gums.  Regular physical activity.  Eating a healthy diet.  Avoiding tobacco and drug use.  Limiting alcohol use.  Practicing safe sex.  Taking low-dose aspirin every day.  Taking vitamin and mineral supplements as recommended by your health care provider. What happens during an annual well check? The services and screenings done by your health care provider during your annual well check will depend on your age, overall health, lifestyle risk factors, and family history of disease. Counseling  Your health care provider may ask you questions about your:  Alcohol use.  Tobacco use.  Drug use.  Emotional well-being.  Home and  relationship well-being.  Sexual activity.  Eating habits.  History of falls.  Memory and ability to understand (cognition).  Work and work Statistician.  Reproductive health. Screening  You may have the following tests or measurements:  Height, weight, and BMI.  Blood pressure.  Lipid and cholesterol levels. These may be checked every 5 years, or more frequently if you are over 60 years old.  Skin check.  Lung cancer screening. You may have this screening every year starting at age 46 if you have a 30-pack-year history of smoking and currently smoke or have quit within the past 15 years.  Fecal occult blood test (FOBT) of the stool. You may have this test every year starting at age 60.  Flexible sigmoidoscopy or colonoscopy. You may have a sigmoidoscopy every 5 years or a colonoscopy every 10 years starting at age 66.  Hepatitis C blood test.  Hepatitis B blood test.  Sexually transmitted disease (STD) testing.  Diabetes screening. This is done by checking your blood sugar (glucose) after you have not eaten for a while (fasting). You may have this done every 1-3 years.  Bone density scan. This is done to screen for osteoporosis. You may have this done starting at age 45.  Mammogram. This may be done every 1-2 years. Talk to your health care provider about how often you should have regular mammograms. Talk with your health care provider about your test results, treatment options, and if necessary, the need for more tests. Vaccines  Your health care provider may recommend certain vaccines, such as:  Influenza vaccine. This is recommended every year.  Tetanus, diphtheria, and acellular pertussis (Tdap, Td) vaccine. You may need a Td  booster every 10 years.  Zoster vaccine. You may need this after age 59.  Pneumococcal 13-valent conjugate (PCV13) vaccine. One dose is recommended after age 22.  Pneumococcal polysaccharide (PPSV23) vaccine. One dose is recommended after  age 35. Talk to your health care provider about which screenings and vaccines you need and how often you need them. This information is not intended to replace advice given to you by your health care provider. Make sure you discuss any questions you have with your health care provider. Document Released: 12/05/2015 Document Revised: 07/28/2016 Document Reviewed: 09/09/2015 Elsevier Interactive Patient Education  2017 [employer] Prevention in the Home Falls can cause injuries. They can happen to people of all ages. There are many things you can do to make your home safe and to help prevent falls. What can I do on the outside of my home?  Regularly fix the edges of walkways and driveways and fix any cracks.  Remove anything that might make you trip as you walk through a door, such as a raised step or threshold.  Trim any bushes or trees on the path to your home.  Use bright outdoor lighting.  Clear any walking paths of anything that might make someone trip, such as rocks or tools.  Regularly check to see if handrails are loose or broken. Make sure that both sides of any steps have handrails.  Any raised decks and porches should have guardrails on the edges.  Have any leaves, snow, or ice cleared regularly.  Use sand or salt on walking paths during winter.  Clean up any spills in your garage right away. This includes oil or grease spills. What can I do in the bathroom?  Use night lights.  Install grab bars by the toilet and in the tub and shower. Do not use towel bars as grab bars.  Use non-skid mats or decals in the tub or shower.  If you need to sit down in the shower, use a plastic, non-slip stool.  Keep the floor dry. Clean up any water that spills on the floor as soon as it happens.  Remove soap buildup in the tub or shower regularly.  Attach bath mats securely with double-sided non-slip rug tape.  Do not have throw rugs and other things on the floor that can  make you trip. What can I do in the bedroom?  Use night lights.  Make sure that you have a light by your bed that is easy to reach.  Do not use any sheets or blankets that are too big for your bed. They should not hang down onto the floor.  Have a firm chair that has side arms. You can use this for support while you get dressed.  Do not have throw rugs and other things on the floor that can make you trip. What can I do in the kitchen?  Clean up any spills right away.  Avoid walking on wet floors.  Keep items that you use a lot in easy-to-reach places.  If you need to reach something above you, use a strong step stool that has a grab bar.  Keep electrical cords out of the way.  Do not use floor polish or wax that makes floors slippery. If you must use wax, use non-skid floor wax.  Do not have throw rugs and other things on the floor that can make you trip. What can I do with my stairs?  Do not leave any items on the stairs.  Make sure that there are handrails on both sides of the stairs and use them. Fix handrails that are broken or loose. Make sure that handrails are as long as the stairways.  Check any carpeting to make sure that it is firmly attached to the stairs. Fix any carpet that is loose or worn.  Avoid having throw rugs at the top or bottom of the stairs. If you do have throw rugs, attach them to the floor with carpet tape.  Make sure that you have a light switch at the top of the stairs and the bottom of the stairs. If you do not have them, ask someone to add them for you. What else can I do to help prevent falls?  Wear shoes that:  Do not have high heels.  Have rubber bottoms.  Are comfortable and fit you well.  Are closed at the toe. Do not wear sandals.  If you use a stepladder:  Make sure that it is fully opened. Do not climb a closed stepladder.  Make sure that both sides of the stepladder are locked into place.  Ask someone to hold it for you,  if possible.  Clearly mark and make sure that you can see:  Any grab bars or handrails.  First and last steps.  Where the edge of each step is.  Use tools that help you move around (mobility aids) if they are needed. These include:  Canes.  Walkers.  Scooters.  Crutches.  Turn on the lights when you go into a dark area. Replace any light bulbs as soon as they burn out.  Set up your furniture so you have a clear path. Avoid moving your furniture around.  If any of your floors are uneven, fix them.  If there are any pets around you, be aware of where they are.  Review your medicines with your doctor. Some medicines can make you feel dizzy. This can increase your chance of falling. Ask your doctor what other things that you can do to help prevent falls. This information is not intended to replace advice given to you by your health care provider. Make sure you discuss any questions you have with your health care provider. Document Released: 09/04/2009 Document Revised: 04/15/2016 Document Reviewed: 12/13/2014 Elsevier Interactive Patient Education  2017 [employer].

## 2017-02-04 NOTE — Progress Notes (Signed)
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 She had a hyst done over 40 yrs ago. 15 yrs after she had a surgery to remove some scar tissue and her bladder was cut in the process. States the twinges of pain happen at random times and last about 20 sec. If she can massage the area, it goes away.     Nurse Concerns:  None

## 2017-02-04 NOTE — Progress Notes (Signed)
Subjective:   Shirley James is a 76 y.o. female who presents for an Initial Medicare Annual Wellness Visit.  Review of Systems     Cardiac Risk Factors include: family history of premature cardiovascular disease;hypertension;advanced age (>12men, >65 women)     Objective:    Today's Vitals   02/04/17 1122  BP: 126/60  Pulse: 81  Temp: 97.6 F (36.4 C)  TempSrc: Oral  SpO2: 98%  Weight: 152 lb 1.6 oz (69 kg)  Height: 5\' 4"  (1.626 m)   Body mass index is 26.11 kg/m.   Current Medications (verified) Outpatient Encounter Prescriptions as of 02/04/2017  Medication Sig  . amLODipine (NORVASC) 10 MG tablet Take 1 tablet (10 mg total) by mouth daily.  Marland Kitchen co-enzyme Q-10 30 MG capsule Take 30 mg by mouth daily.  Marland Kitchen losartan-hydrochlorothiazide (HYZAAR) 100-25 MG tablet Take 1 tablet by mouth daily.  . [DISCONTINUED] Melatonin 5 MG TABS Take 1 tablet by mouth. Reported on 05/31/2016   No facility-administered encounter medications on file as of 02/04/2017.     Allergies (verified) Codeine   History: Past Medical History:  Diagnosis Date  . Hypertension    Past Surgical History:  Procedure Laterality Date  . ABDOMINAL HYSTERECTOMY    . LAPAROSCOPIC BILATERAL SALPINGO OOPHERECTOMY Bilateral 2005  . TONSILECTOMY, ADENOIDECTOMY, BILATERAL MYRINGOTOMY AND TUBES     Family History  Problem Relation Age of Onset  . Hypertension Mother   . Diabetes Maternal Uncle   . Diabetes Maternal Uncle    Social History   Occupational History  . Not on file.   Social History Main Topics  . Smoking status: Former Research scientist (life sciences)  . Smokeless tobacco: Never Used  . Alcohol use Yes     Comment: Occasionally  . Drug use: No  . Sexual activity: Not Currently    Tobacco Counseling Counseling given: No   Activities of Daily Living In your present state of health, do you have any difficulty performing the following activities: 02/04/2017  Hearing? N  Vision? N  Difficulty concentrating  or making decisions? Y  Walking or climbing stairs? N  Dressing or bathing? N  Doing errands, shopping? N  Preparing Food and eating ? N  In the past six months, have you accidently leaked urine? N  Do you have problems with loss of bowel control? N  Managing your Medications? N  Managing your Finances? N  Housekeeping or managing your Housekeeping? N  Some recent data might be hidden    Immunizations and Health Maintenance Immunization History  Administered Date(s) Administered  . Influenza,inj,Quad PF,36+ Mos 08/01/2013, 12/02/2015  . Influenza-Unspecified 09/18/2014, 08/25/2016  . Pneumococcal Conjugate-13 05/13/2014  . Pneumococcal Polysaccharide-23 05/31/2016  . Tdap 11/05/2014   There are no preventive care reminders to display for this patient.  Patient Care Team: Donella Stade, PA-C as PCP - General (Family Medicine)  Indicate any recent Medical Services you may have received from other than [facility] providers in the past year (date may be approximate).     Assessment:   This is a routine wellness examination for [place].   Hearing/Vision screen Hearing Screening Comments: Pt has never had a hearing screen. Pt has not had hearing loss at this time.  Vision Screening Comments: Last eye exam was done in April '17 at [facility]. Pt to schedule next appt.   Dietary issues and exercise activities discussed: Current Exercise Habits: Home exercise routine, Type of exercise: walking, Time (Minutes): 60, Frequency (Times/Week): 7, Weekly Exercise (  Minutes/Week): 420, Intensity: Mild  Goals    . Increase water intake          Starting 02/04/17, I will attempt to increase my water intake to 3 glasses per day.       Depression Screen PHQ 2/9 Scores 02/04/2017 05/31/2016  PHQ - 2 Score 1 0    Fall Risk Fall Risk  02/04/2017 05/31/2016  Falls in the past year? No No    Cognitive Function: MMSE - Mini Mental State Exam 02/04/2017  Orientation to time 5   Orientation to Place 5  Registration 3  Attention/ Calculation 5  Recall 3  Language- name 2 objects 2  Language- repeat 1  Language- follow 3 step command 3  Language- read & follow direction 1  Write a sentence 1  Copy design 1  Total score 30        Screening Tests Health Maintenance  Topic Date Due  . TETANUS/TDAP  11/05/2024  . COLONOSCOPY  07/23/2026  . INFLUENZA VACCINE  Completed  . DEXA SCAN  Completed  . PNA vac Low Risk Adult  Completed      Plan:    I have personally reviewed and addressed the Medicare Annual Wellness questionnaire and have noted the following in the patient's chart:  A. Medical and social history B. Use of alcohol, tobacco or illicit drugs  C. Current medications and supplements D. Functional ability and status E.  Nutritional status F.  Physical activity G. Advance directives H. List of other physicians I.  Hospitalizations, surgeries, and ER visits in previous 12 months J.  [place] to include hearing, vision, cognitive, depression L. Referrals and appointments - none  In addition, I have reviewed and discussed with patient certain preventive protocols, quality metrics, and best practice recommendations. A written personalized care plan for preventive services as well as general preventive health recommendations were provided to patient.  See attached scanned questionnaire for additional information.   Signed,   Allyn Kenner, LPN Health Advisor   Reviewed and agree with above encounter.  [name] PA-C

## 2017-02-11 ENCOUNTER — Other Ambulatory Visit: Payer: Self-pay | Admitting: Physician Assistant

## 2017-02-11 DIAGNOSIS — Z1231 Encounter for screening mammogram for malignant neoplasm of breast: Secondary | ICD-10-CM

## 2017-03-01 ENCOUNTER — Ambulatory Visit (INDEPENDENT_AMBULATORY_CARE_PROVIDER_SITE_OTHER): Payer: Medicare Other

## 2017-03-01 DIAGNOSIS — Z1231 Encounter for screening mammogram for malignant neoplasm of breast: Secondary | ICD-10-CM | POA: Diagnosis not present

## 2017-06-03 ENCOUNTER — Ambulatory Visit: Payer: Self-pay | Admitting: Physician Assistant

## 2017-07-11 ENCOUNTER — Encounter: Payer: Self-pay | Admitting: Physician Assistant

## 2017-07-11 ENCOUNTER — Ambulatory Visit (INDEPENDENT_AMBULATORY_CARE_PROVIDER_SITE_OTHER): Payer: Medicare Other | Admitting: Physician Assistant

## 2017-07-11 VITALS — BP 128/47 | HR 69 | Wt 148.0 lb

## 2017-07-11 DIAGNOSIS — I10 Essential (primary) hypertension: Secondary | ICD-10-CM | POA: Diagnosis not present

## 2017-07-11 DIAGNOSIS — Z23 Encounter for immunization: Secondary | ICD-10-CM

## 2017-07-11 DIAGNOSIS — R202 Paresthesia of skin: Secondary | ICD-10-CM | POA: Insufficient documentation

## 2017-07-11 MED ORDER — LOSARTAN POTASSIUM-HCTZ 100-25 MG PO TABS: 1.0000 | ORAL_TABLET | Freq: Every day | ORAL | 3 refills | Status: DC

## 2017-07-11 MED ORDER — AMLODIPINE BESYLATE 10 MG PO TABS: 10.0000 mg | ORAL_TABLET | Freq: Every day | ORAL | 3 refills | Status: DC

## 2017-07-11 NOTE — Progress Notes (Signed)
Subjective:    Patient ID: Shirley James, female    DOB: June 23, 1941, 76 y.o.   MRN: [id number]  HPI Pt is a 76 yo female who presents to the clinic for 6 month follow up on HTN.   HTN- doing great. No concerns. No CP, palpitations, headaches, vision changes.   She does complain for the last few weeks of bilateral thumb, index, middle finget numbness and tingling. She only notices it in the middle of the night. She denies any injuries. She is crocheting more that she used to. She has not tried anything to make better.   .. Active Ambulatory Problems    Diagnosis Date Noted  . Essential hypertension, benign 08/03/2013  . Insomnia 08/03/2013  . PND (post-nasal drip) 11/07/2013  . Heart murmur, systolic 05/13/2014  . Low serum vitamin B12 03/23/2015  . Vitamin D deficiency 03/23/2015  . Other fatigue 03/23/2015  . LUQ pain 03/16/2016   Resolved Ambulatory Problems    Diagnosis Date Noted  . Epigastric pain 03/16/2016  . Diarrhea 03/16/2016  . H. pylori infection 03/17/2016   Past Medical History:  Diagnosis Date  . Hypertension       Review of Systems  All other systems reviewed and are negative.      Objective:   Physical Exam  Constitutional: She is oriented to person, place, and time. She appears well-developed and well-nourished.  HENT:  Head: Normocephalic and atraumatic.  Neck: Normal range of motion. Neck supple.  Cardiovascular: Normal rate, regular rhythm and normal heart sounds.   Pulmonary/Chest: Effort normal and breath sounds normal.  Musculoskeletal:  Negative phalens.  Negative tinels.  Normal ROM No swelling or pinpoint tenderness.   Lymphadenopathy:    She has no cervical adenopathy.  Neurological: She is alert and oriented to person, place, and time.  Psychiatric: She has a normal mood and affect. Her behavior is normal.          Assessment & Plan:  Marland KitchenMarland KitchenSaumya was seen today for follow-up.  Diagnoses and all orders for this  visit:  Essential hypertension, benign -     losartan-hydrochlorothiazide (HYZAAR) 100-25 MG tablet; Take 1 tablet by mouth daily. -     amLODipine (NORVASC) 10 MG tablet; Take 1 tablet (10 mg total) by mouth daily. -     COMPLETE METABOLIC PANEL WITH GFR  Need for influenza vaccination -     Flu vaccine HIGH DOSE PF    Depression screen [facility] 2/9 07/11/2017 02/04/2017 05/31/2016  Decreased Interest 0 0 0  Down, Depressed, Hopeless 0 1 0  PHQ - 2 Score 0 1 0  Altered sleeping 0 - -  Tired, decreased energy 0 - -  Change in appetite 0 - -  Feeling bad or failure about yourself  0 - -  Trouble concentrating 0 - -  Moving slowly or fidgety/restless 0 - -  Suicidal thoughts 0 - -  PHQ-9 Score 0 - -   Refilled for 6 months.   Discussed she could be compressing her hands while she sleeps. Discussed starting night splints. She will consider. Follow up as needed.

## 2017-08-03 ENCOUNTER — Other Ambulatory Visit: Payer: Self-pay | Admitting: Physician Assistant

## 2017-08-06 ENCOUNTER — Other Ambulatory Visit: Payer: Self-pay | Admitting: Physician Assistant

## 2017-08-17 DIAGNOSIS — H25813 Combined forms of age-related cataract, bilateral: Secondary | ICD-10-CM | POA: Diagnosis not present

## 2017-08-17 DIAGNOSIS — H527 Unspecified disorder of refraction: Secondary | ICD-10-CM | POA: Diagnosis not present

## 2017-08-17 DIAGNOSIS — H43813 Vitreous degeneration, bilateral: Secondary | ICD-10-CM | POA: Diagnosis not present

## 2017-09-01 ENCOUNTER — Other Ambulatory Visit: Payer: Self-pay | Admitting: Physician Assistant

## 2017-12-19 ENCOUNTER — Ambulatory Visit (INDEPENDENT_AMBULATORY_CARE_PROVIDER_SITE_OTHER): Payer: Medicare Other | Admitting: Physician Assistant

## 2017-12-19 ENCOUNTER — Encounter: Payer: Self-pay | Admitting: Physician Assistant

## 2017-12-19 VITALS — BP 130/60 | HR 77 | Temp 97.5°F | Resp 16 | Wt 148.0 lb

## 2017-12-19 DIAGNOSIS — Z131 Encounter for screening for diabetes mellitus: Secondary | ICD-10-CM

## 2017-12-19 DIAGNOSIS — I10 Essential (primary) hypertension: Secondary | ICD-10-CM

## 2017-12-19 DIAGNOSIS — F5101 Primary insomnia: Secondary | ICD-10-CM | POA: Diagnosis not present

## 2017-12-19 DIAGNOSIS — H9313 Tinnitus, bilateral: Secondary | ICD-10-CM

## 2017-12-19 DIAGNOSIS — G5603 Carpal tunnel syndrome, bilateral upper limbs: Secondary | ICD-10-CM | POA: Diagnosis not present

## 2017-12-19 DIAGNOSIS — Z13220 Encounter for screening for lipoid disorders: Secondary | ICD-10-CM

## 2017-12-19 MED ORDER — AMLODIPINE BESYLATE 10 MG PO TABS: 10.0000 mg | ORAL_TABLET | Freq: Every day | ORAL | 3 refills | Status: DC

## 2017-12-19 MED ORDER — LOSARTAN POTASSIUM-HCTZ 100-25 MG PO TABS: 1.0000 | ORAL_TABLET | Freq: Every day | ORAL | 3 refills | Status: DC

## 2017-12-19 MED ORDER — TRAZODONE HCL 50 MG PO TABS: 50.0000 mg | ORAL_TABLET | Freq: Every day | ORAL | 2 refills | Status: DC

## 2017-12-19 NOTE — Patient Instructions (Signed)
Carpal Tunnel Syndrome Carpal tunnel syndrome is a condition that causes pain in your hand and arm. The carpal tunnel is a narrow area located on the palm side of your wrist. Repeated wrist motion or certain diseases may cause swelling within the tunnel. This swelling pinches the main nerve in the wrist (median nerve). What are the causes? This condition may be caused by:  Repeated wrist motions.  Wrist injuries.  Arthritis.  A cyst or tumor in the carpal tunnel.  Fluid buildup during pregnancy.  Sometimes the cause of this condition is not known. What increases the risk? This condition is more likely to develop in:  People who have jobs that cause them to repeatedly move their wrists in the same motion, such as butchers and cashiers.  Women.  People with certain conditions, such as: ? Diabetes. ? Obesity. ? An underactive thyroid (hypothyroidism). ? Kidney failure.  What are the signs or symptoms? Symptoms of this condition include:  A tingling feeling in your fingers, especially in your thumb, index, and middle fingers.  Tingling or numbness in your hand.  An aching feeling in your entire arm, especially when your wrist and elbow are bent for long periods of time.  Wrist pain that goes up your arm to your shoulder.  Pain that goes down into your palm or fingers.  A weak feeling in your hands. You may have trouble grabbing and holding items.  Your symptoms may feel worse during the night. How is this diagnosed? This condition is diagnosed with a medical history and physical exam. You may also have tests, including:  An electromyogram (EMG). This test measures electrical signals sent by your nerves into the muscles.  X-rays.  How is this treated? Treatment for this condition includes:  Lifestyle changes. It is important to stop doing or modify the activity that caused your condition.  Physical or occupational therapy.  Medicines for pain and inflammation.  This may include medicine that is injected into your wrist.  A wrist splint.  Surgery.  Follow these instructions at home: If you have a splint:  Wear it as told by your health care provider. Remove it only as told by your health care provider.  Loosen the splint if your fingers become numb and tingle, or if they turn cold and blue.  Keep the splint clean and dry. General instructions  Take over-the-counter and prescription medicines only as told by your health care provider.  Rest your wrist from any activity that may be causing your pain. If your condition is work related, talk to your employer about changes that can be made, such as getting a wrist pad to use while typing.  If directed, apply ice to the painful area: ? Put ice in a plastic bag. ? Place a towel between your skin and the bag. ? Leave the ice on for 20 minutes, 2-3 times per day.  Keep all follow-up visits as told by your health care provider. This is important.  Do any exercises as told by your health care provider, physical therapist, or occupational therapist. Contact a health care provider if:  You have new symptoms.  Your pain is not controlled with medicines.  Your symptoms get worse. This information is not intended to replace advice given to you by your health care provider. Make sure you discuss any questions you have with your health care provider. Document Released: 11/05/2000 Document Revised: 03/18/2016 Document Reviewed: 03/26/2015 Elsevier Interactive Patient Education  2018 [employer]  

## 2017-12-19 NOTE — Progress Notes (Addendum)
Subjective:    Patient ID: Shirley James, female    DOB: 1941-04-25, 77 y.o.   MRN: [id number]  HPI   Pt is a 77 year old female presenting to the clinic with insomnia, bilateral numbness and tingling in the hands, bilateral tinnitus. She is enrolled in an Alzheimer's medication trial that is given IV monthly but has not had a dosing change since April 2018.   Insomnia : She reports that for the last three months she has had difficulty getting to sleep and typically will not sleep for two days and then will get one night of sleep from being so exhausted. She has tried OTC melatonin 10mg  with no benefit. She typically will read before bed and then will end up just lying in bed. Her husband does snore but even if she goes on the couch there she cannot sleep. She drinks a starbucks coffee in the morning and in the afternoon she will have a soda. She is open to trying medication to help her sleep.   Bilateral numbness and tingling : She reports that at night she intermittently will have numbness, tingling, and burning in her first three digits of her left and right hand. Usually they occur separately but she has had symptoms isolated in her left and then right hands. The right hand is usually worse. She said she does sleep with her wrists flexed. She has tried to massage her wrist but has not tried splinting or anti-inflammatories.   Tinnitus: Bilateral tinnitus for the last few months that she is noticed when she is in quiet environments. She does not notice it in louder environments. She denies hearing loss or dizziness.   She also reported approximately 2 days of pain and swelling on her left foot on the dorsal aspect of the foot. The pain and swelling has since subsided and she is not concerned at this time.  In addition she has a small bump on her right third finger at the DIP joint. She denies pain or limitations in ROM.   HTN- doing well. No concerns or complaints. No CP, palpitations,  headaches or vision changes.   .. Active Ambulatory Problems    Diagnosis Date Noted  . Essential hypertension, benign 08/03/2013  . Insomnia 08/03/2013  . PND (post-nasal drip) 11/07/2013  . Heart murmur, systolic 05/13/2014  . Low serum vitamin B12 03/23/2015  . Vitamin D deficiency 03/23/2015  . Other fatigue 03/23/2015  . LUQ pain 03/16/2016  . Paresthesia of both hands 07/11/2017   Resolved Ambulatory Problems    Diagnosis Date Noted  . Epigastric pain 03/16/2016  . Diarrhea 03/16/2016  . H. pylori infection 03/17/2016   Past Medical History:  Diagnosis Date  . Hypertension        Review of Systems  Constitutional: Negative for chills, fever and unexpected weight change.  HENT: Positive for tinnitus. Negative for congestion, postnasal drip, rhinorrhea, sinus pressure and sore throat.   Respiratory: Negative for chest tightness and shortness of breath.   Cardiovascular: Negative for chest pain and palpitations.  Neurological: Negative for dizziness and headaches.   Past Medical History:  Diagnosis Date  . Hypertension        Objective:   Physical Exam  Constitutional: She is oriented to person, place, and time. She appears well-developed and well-nourished. No distress.  HENT:  Head: Normocephalic and atraumatic.  Right Ear: Hearing, tympanic membrane and external ear normal.  Left Ear: Hearing, tympanic membrane and external ear normal.  Mouth/Throat:  Oropharynx is clear and moist.  Cardiovascular: Normal rate, regular rhythm and normal heart sounds. Exam reveals no gallop and no friction rub.  No murmur heard. Pulmonary/Chest: Effort normal and breath sounds normal. No respiratory distress.  Musculoskeletal:       Arms: Small protrusion, no tenderness on the lateral DIP joint. No edema or erythema on the left dorsal foot. Pt positive phalens sign bilaterally. Negative tinels bilaterally.   Neurological: She is alert and oriented to person, place, and  time.  Psychiatric: She has a normal mood and affect. Her behavior is normal.   Vitals:   12/19/17 0836  BP: 130/60  Pulse: 77  Resp: 16  Temp: (!) 97.5 F (36.4 C)  SpO2: 100%      Assessment & Plan:   Anet was seen today for tinnitus, foot problem, nail problem and hair/scalp problem.  Diagnoses and all orders for this visit:  Tinnitus of both ears -     CBC with Differential/Platelet  Bilateral carpal tunnel syndrome -     CBC with Differential/Platelet -     TSH -     B12  Primary insomnia -     traZODone (DESYREL) 50 MG tablet; Take 1 tablet (50 mg total) by mouth at bedtime.  Essential hypertension, benign -     COMPLETE METABOLIC PANEL WITH GFR -     amLODipine (NORVASC) 10 MG tablet; Take 1 tablet (10 mg total) by mouth daily. -     losartan-hydrochlorothiazide (HYZAAR) 100-25 MG tablet; Take 1 tablet by mouth daily.  Screening for diabetes mellitus -     COMPLETE METABOLIC PANEL WITH GFR  Screening for lipid disorders -     Lipid Panel w/reflex Direct LDL   Pt was given education on the management and causes of carpal tunnel. She understands that for her carpal tunnel she would benefit from wearing wrist splints at night that she said she would order online. She can ice the wrists prior to bed and can take anti-inflammatories as needed for her symptoms.if not improving could consider hydrodissection or surgery.    It was explained the tinnitus is common as people age and there is not specific treatment. If it causes her significant distress or gets worse we can send a referral to ENT for a full evaluation of her hearing. She can try Flonase to try to open up her eustachian tube and help normalize pressures.    For her insomnia she will start with a half tab of trazodone and will titrate up to a full tablet if she has no response to the half tablet. She should not take caffeine close to bedtime and avoid blue-light like TV or cell phones prior to bed. We will  monitor her left foot and right third finger for pain or worsening symptoms at this time.  We will call her with the results of her routine lab work and she is welcome to call or message me on myChart with questions or concerns.    Marland Kitchen

## 2017-12-23 DIAGNOSIS — D519 Vitamin B12 deficiency anemia, unspecified: Secondary | ICD-10-CM | POA: Diagnosis not present

## 2017-12-23 DIAGNOSIS — H9313 Tinnitus, bilateral: Secondary | ICD-10-CM | POA: Diagnosis not present

## 2017-12-23 DIAGNOSIS — Z13220 Encounter for screening for lipoid disorders: Secondary | ICD-10-CM | POA: Diagnosis not present

## 2017-12-23 DIAGNOSIS — G5603 Carpal tunnel syndrome, bilateral upper limbs: Secondary | ICD-10-CM | POA: Diagnosis not present

## 2017-12-23 DIAGNOSIS — Z131 Encounter for screening for diabetes mellitus: Secondary | ICD-10-CM | POA: Diagnosis not present

## 2017-12-23 DIAGNOSIS — I10 Essential (primary) hypertension: Secondary | ICD-10-CM | POA: Diagnosis not present

## 2017-12-23 DIAGNOSIS — R7989 Other specified abnormal findings of blood chemistry: Secondary | ICD-10-CM | POA: Diagnosis not present

## 2017-12-24 LAB — CBC WITH DIFFERENTIAL/PLATELET
BASOS ABS: 29 {cells}/uL (ref 0–200)
Basophils Relative: 0.8 %
EOS ABS: 72 {cells}/uL (ref 15–500)
Eosinophils Relative: 2 %
HCT: 37.8 % (ref 35.0–45.0)
Hemoglobin: 13.1 g/dL (ref 11.7–15.5)
Lymphs Abs: 1714 cells/uL (ref 850–3900)
MCH: 31.2 pg (ref 27.0–33.0)
MCHC: 34.7 g/dL (ref 32.0–36.0)
MCV: 90 fL (ref 80.0–100.0)
MPV: 8.9 fL (ref 7.5–12.5)
Monocytes Relative: 10.4 %
NEUTROS PCT: 39.2 %
Neutro Abs: 1411 cells/uL — ABNORMAL LOW (ref 1500–7800)
PLATELETS: 335 10*3/uL (ref 140–400)
RBC: 4.2 10*6/uL (ref 3.80–5.10)
RDW: 12.1 % (ref 11.0–15.0)
TOTAL LYMPHOCYTE: 47.6 %
WBC mixed population: 374 cells/uL (ref 200–950)
WBC: 3.6 10*3/uL — ABNORMAL LOW (ref 3.8–10.8)

## 2017-12-24 LAB — COMPLETE METABOLIC PANEL WITH GFR
AG Ratio: 1.5 (calc) (ref 1.0–2.5)
ALBUMIN MSPROF: 4.1 g/dL (ref 3.6–5.1)
ALT: 12 U/L (ref 6–29)
AST: 14 U/L (ref 10–35)
Alkaline phosphatase (APISO): 61 U/L (ref 33–130)
BUN: 18 mg/dL (ref 7–25)
CO2: 31 mmol/L (ref 20–32)
CREATININE: 0.77 mg/dL (ref 0.60–0.93)
Calcium: 9.3 mg/dL (ref 8.6–10.4)
Chloride: 102 mmol/L (ref 98–110)
GFR, Est African American: 87 mL/min/{1.73_m2} (ref >=60)
GFR, Est Non African American: 75 mL/min/{1.73_m2} (ref >=60)
GLUCOSE: 98 mg/dL (ref 65–99)
Globulin: 2.8 g/dL (calc) (ref 1.9–3.7)
Potassium: 3.9 mmol/L (ref 3.5–5.3)
Sodium: 139 mmol/L (ref 135–146)
TOTAL PROTEIN: 6.9 g/dL (ref 6.1–8.1)
Total Bilirubin: 0.5 mg/dL (ref 0.2–1.2)

## 2017-12-24 LAB — LIPID PANEL W/REFLEX DIRECT LDL
CHOL/HDL RATIO: 2.5 (calc) (ref <5.0)
CHOLESTEROL: 201 mg/dL — AB (ref <200)
HDL: 80 mg/dL (ref >50)
LDL CHOLESTEROL (CALC): 104 mg/dL — AB
Non-HDL Cholesterol (Calc): 121 mg/dL (calc) (ref <130)
TRIGLYCERIDES: 76 mg/dL (ref <150)

## 2017-12-24 LAB — VITAMIN B12: VITAMIN B 12: 380 pg/mL (ref 200–1100)

## 2017-12-24 LAB — TSH: TSH: 1.58 mIU/L (ref 0.40–4.50)

## 2017-12-26 NOTE — Progress Notes (Signed)
Call pt: kidney, liver, glucose look great.  Cholesterol great.  B12 could be a little higher. Increase b12 rich foods.  Thyroid perfect.

## 2018-01-13 ENCOUNTER — Ambulatory Visit: Payer: Self-pay | Admitting: Physician Assistant

## 2018-06-16 ENCOUNTER — Ambulatory Visit: Payer: Self-pay | Admitting: Physician Assistant

## 2018-06-19 ENCOUNTER — Ambulatory Visit: Payer: Self-pay | Admitting: Physician Assistant

## 2018-06-21 ENCOUNTER — Ambulatory Visit: Payer: Self-pay | Admitting: Physician Assistant

## 2018-06-23 ENCOUNTER — Ambulatory Visit (INDEPENDENT_AMBULATORY_CARE_PROVIDER_SITE_OTHER): Payer: Medicare Other | Admitting: Physician Assistant

## 2018-06-23 ENCOUNTER — Encounter: Payer: Self-pay | Admitting: Physician Assistant

## 2018-06-23 VITALS — BP 130/51 | HR 78 | Ht 64.0 in | Wt 145.0 lb

## 2018-06-23 DIAGNOSIS — I10 Essential (primary) hypertension: Secondary | ICD-10-CM

## 2018-06-23 NOTE — Patient Instructions (Signed)
Consider shingrix

## 2018-06-24 ENCOUNTER — Encounter: Payer: Self-pay | Admitting: Physician Assistant

## 2018-06-24 NOTE — Progress Notes (Signed)
Subjective:    Patient ID: Shirley James, female    DOB: [birth year], 77 y.o.   MRN: [id number]  HPI Pt is a 77 yo female who presents to the clinic for 6 month BP follow up. She denies any problems or concerns. Denies any CP, palpitations, headaches or vision changes.   .. Active Ambulatory Problems    Diagnosis Date Noted  . Essential hypertension, benign 08/03/2013  . Insomnia 08/03/2013  . PND (post-nasal drip) 11/07/2013  . Heart murmur, systolic 05/13/2014  . Low serum vitamin B12 03/23/2015  . Vitamin D deficiency 03/23/2015  . Other fatigue 03/23/2015  . LUQ pain 03/16/2016  . Paresthesia of both hands 07/11/2017  . Bilateral carpal tunnel syndrome 12/19/2017  . Tinnitus of both ears 12/19/2017   Resolved Ambulatory Problems    Diagnosis Date Noted  . Epigastric pain 03/16/2016  . Diarrhea 03/16/2016  . H. pylori infection 03/17/2016   Past Medical History:  Diagnosis Date  . Hypertension       Review of Systems  All other systems reviewed and are negative.      Objective:   Physical Exam  Constitutional: She is oriented to person, place, and time. She appears well-developed and well-nourished.  HENT:  Head: Normocephalic and atraumatic.  Neck: Normal range of motion. Neck supple.  Cardiovascular: Normal rate and regular rhythm.  Pulmonary/Chest: Effort normal and breath sounds normal.  Neurological: She is alert and oriented to person, place, and time.  Psychiatric: She has a normal mood and affect. Her behavior is normal.          Assessment & Plan:  Marland KitchenMarland KitchenDiagnoses and all orders for this visit:  Essential hypertension, benign   BP controlled. No problems or concerns. Refilled medication. 6 month follow up with CPE. Labs up to date.   Encouraged shingles vaccine. Gave HO.

## 2018-09-11 ENCOUNTER — Other Ambulatory Visit: Payer: Self-pay | Admitting: Physician Assistant

## 2018-09-11 DIAGNOSIS — I10 Essential (primary) hypertension: Secondary | ICD-10-CM

## 2018-09-12 ENCOUNTER — Telehealth: Payer: Self-pay

## 2018-09-12 NOTE — Telephone Encounter (Signed)
Ok to send both separate.

## 2018-09-12 NOTE — Telephone Encounter (Signed)
[facility] called and states the Losartan-HCTZ is out of stock. They will need a new prescription for both medications separate. Please advise.

## 2018-09-13 MED ORDER — HYDROCHLOROTHIAZIDE 25 MG PO TABS: 25.0000 mg | ORAL_TABLET | Freq: Every day | ORAL | 0 refills | Status: DC

## 2018-09-13 MED ORDER — LOSARTAN POTASSIUM 100 MG PO TABS: 100.0000 mg | ORAL_TABLET | Freq: Every day | ORAL | 0 refills | Status: DC

## 2018-09-13 NOTE — Telephone Encounter (Signed)
Combo pill removed from med list. Both separate RX sent to [facility]. Called and advised pt.

## 2018-10-05 ENCOUNTER — Other Ambulatory Visit: Payer: Self-pay | Admitting: Physician Assistant

## 2018-10-09 ENCOUNTER — Other Ambulatory Visit: Payer: Self-pay

## 2018-10-09 MED ORDER — HYDROCHLOROTHIAZIDE 25 MG PO TABS: 25.0000 mg | ORAL_TABLET | Freq: Every day | ORAL | 0 refills | Status: DC

## 2018-10-09 MED ORDER — LOSARTAN POTASSIUM 100 MG PO TABS: 100.0000 mg | ORAL_TABLET | Freq: Every day | ORAL | 0 refills | Status: DC

## 2018-11-03 ENCOUNTER — Other Ambulatory Visit: Payer: Self-pay | Admitting: Physician Assistant

## 2018-11-25 ENCOUNTER — Other Ambulatory Visit: Payer: Self-pay | Admitting: Physician Assistant

## 2018-11-29 ENCOUNTER — Other Ambulatory Visit: Payer: Self-pay | Admitting: Physician Assistant

## 2018-12-04 ENCOUNTER — Ambulatory Visit (INDEPENDENT_AMBULATORY_CARE_PROVIDER_SITE_OTHER): Payer: Medicare Other | Admitting: Physician Assistant

## 2018-12-04 ENCOUNTER — Encounter: Payer: Self-pay | Admitting: Physician Assistant

## 2018-12-04 ENCOUNTER — Telehealth: Payer: Self-pay | Admitting: Physician Assistant

## 2018-12-04 VITALS — BP 137/72 | HR 90 | Ht 64.0 in | Wt 147.0 lb

## 2018-12-04 DIAGNOSIS — Z1231 Encounter for screening mammogram for malignant neoplasm of breast: Secondary | ICD-10-CM | POA: Diagnosis not present

## 2018-12-04 DIAGNOSIS — Z131 Encounter for screening for diabetes mellitus: Secondary | ICD-10-CM

## 2018-12-04 DIAGNOSIS — Z13820 Encounter for screening for osteoporosis: Secondary | ICD-10-CM

## 2018-12-04 DIAGNOSIS — Z13220 Encounter for screening for lipoid disorders: Secondary | ICD-10-CM

## 2018-12-04 DIAGNOSIS — Z0000 Encounter for general adult medical examination without abnormal findings: Secondary | ICD-10-CM | POA: Diagnosis not present

## 2018-12-04 DIAGNOSIS — I351 Nonrheumatic aortic (valve) insufficiency: Secondary | ICD-10-CM

## 2018-12-04 DIAGNOSIS — Z780 Asymptomatic menopausal state: Secondary | ICD-10-CM

## 2018-12-04 DIAGNOSIS — E041 Nontoxic single thyroid nodule: Secondary | ICD-10-CM

## 2018-12-04 DIAGNOSIS — Z23 Encounter for immunization: Secondary | ICD-10-CM | POA: Diagnosis not present

## 2018-12-04 DIAGNOSIS — M810 Age-related osteoporosis without current pathological fracture: Secondary | ICD-10-CM | POA: Diagnosis not present

## 2018-12-04 DIAGNOSIS — I10 Essential (primary) hypertension: Secondary | ICD-10-CM | POA: Diagnosis not present

## 2018-12-04 DIAGNOSIS — E7800 Pure hypercholesterolemia, unspecified: Secondary | ICD-10-CM | POA: Diagnosis not present

## 2018-12-04 DIAGNOSIS — R0989 Other specified symptoms and signs involving the circulatory and respiratory systems: Secondary | ICD-10-CM

## 2018-12-04 MED ORDER — AMLODIPINE BESYLATE 10 MG PO TABS: 10.0000 mg | ORAL_TABLET | Freq: Every day | ORAL | 3 refills | Status: DC

## 2018-12-04 MED ORDER — LOSARTAN POTASSIUM 100 MG PO TABS: 100.0000 mg | ORAL_TABLET | Freq: Every day | ORAL | 3 refills | Status: DC

## 2018-12-04 MED ORDER — HYDROCHLOROTHIAZIDE 25 MG PO TABS: 25.0000 mg | ORAL_TABLET | Freq: Every day | ORAL | 3 refills | Status: DC

## 2018-12-04 NOTE — Telephone Encounter (Signed)
Can we educate patient and inform her that the next shingrix dose needs to be administered in the pharmacy so medicare will pay for it. Next injection will be in 2 months.

## 2018-12-04 NOTE — Patient Instructions (Signed)
Health Maintenance After Age 78 After age 78, you are at a higher risk for certain long-term diseases and infections as well as injuries from falls. Falls are a major cause of broken bones and head injuries in people who are older than age 78. Getting regular preventive care can help to keep you healthy and well. Preventive care includes getting regular testing and making lifestyle changes as recommended by your health care provider. Talk with your health care provider about:  Which screenings and tests you should have. A screening is a test that checks for a disease when you have no symptoms.  A diet and exercise plan that is right for you. What should I know about screenings and tests to prevent falls? Screening and testing are the best ways to find a health problem early. Early diagnosis and treatment give you the best chance of managing medical conditions that are common after age 78. Certain conditions and lifestyle choices may make you more likely to have a fall. Your health care provider may recommend:  Regular vision checks. Poor vision and conditions such as cataracts can make you more likely to have a fall. If you wear glasses, make sure to get your prescription updated if your vision changes.  Medicine review. Work with your health care provider to regularly review all of the medicines you are taking, including over-the-counter medicines. Ask your health care provider about any side effects that may make you more likely to have a fall. Tell your health care provider if any medicines that you take make you feel dizzy or sleepy.  Osteoporosis screening. Osteoporosis is a condition that causes the bones to get weaker. This can make the bones weak and cause them to break more easily.  Blood pressure screening. Blood pressure changes and medicines to control blood pressure can make you feel dizzy.  Strength and balance checks. Your health care provider may recommend certain tests to check your  strength and balance while standing, walking, or changing positions.  Foot health exam. Foot pain and numbness, as well as not wearing proper footwear, can make you more likely to have a fall.  Depression screening. You may be more likely to have a fall if you have a fear of falling, feel emotionally low, or feel unable to do activities that you used to do.  Alcohol use screening. Using too much alcohol can affect your balance and may make you more likely to have a fall. What actions can I take to lower my risk of falls? General instructions  Talk with your health care provider about your risks for falling. Tell your health care provider if: ? You fall. Be sure to tell your health care provider about all falls, even ones that seem minor. ? You feel dizzy, sleepy, or off-balance.  Take over-the-counter and prescription medicines only as told by your health care provider. These include any supplements.  Eat a healthy diet and maintain a healthy weight. A healthy diet includes low-fat dairy products, low-fat (lean) meats, and fiber from whole grains, beans, and lots of fruits and vegetables. Home safety  Remove any tripping hazards, such as rugs, cords, and clutter.  Install safety equipment such as grab bars in bathrooms and safety rails on stairs.  Keep rooms and walkways well-lit. Activity   Follow a regular exercise program to stay fit. This will help you maintain your balance. Ask your health care provider what types of exercise are appropriate for you.  If you need a cane or   walker, use it as recommended by your health care provider.  Wear supportive shoes that have nonskid soles. Lifestyle  Do not drink alcohol if your health care provider tells you not to drink.  If you drink alcohol, limit how much you have: ? 0-1 drink a day for women. ? 0-2 drinks a day for men.  Be aware of how much alcohol is in your drink. In the [place] one drink equals one typical bottle of beer (12  oz), one-half glass of wine (5 oz), or one shot of hard liquor (1 oz).  Do not use any products that contain nicotine or tobacco, such as cigarettes and e-cigarettes. If you need help quitting, ask your health care provider. Summary  Having a healthy lifestyle and getting preventive care can help to protect your health and wellness after age 78.  Screening and testing are the best way to find a health problem early and help you avoid having a fall. Early diagnosis and treatment give you the best chance for managing medical conditions that are more common for people who are older than age 78.  Falls are a major cause of broken bones and head injuries in people who are older than age 78. Take precautions to prevent a fall at home.  Work with your health care provider to learn what changes you can make to improve your health and wellness and to prevent falls. This information is not intended to replace advice given to you by your health care provider. Make sure you discuss any questions you have with your health care provider. Document Released: 09/21/2017 Document Revised: 09/21/2017 Document Reviewed: 09/21/2017 Elsevier Interactive Patient Education  2019 Elsevier Inc.  

## 2018-12-04 NOTE — Progress Notes (Addendum)
Subjective:   Shirley James is a 78 y.o. female who presents for Medicare Annual (Subsequent) preventive examination.  Review of Systems:  No problems or concerns today.        Objective:     Vitals: BP 137/72   Pulse 90   Ht 5\' 4"  (1.626 m)   Wt 147 lb (66.7 kg)   BMI 25.23 kg/m   Body mass index is 25.23 kg/m.  NSR, systolic murmur, right carotid bruit.  Lungs clear to auscultation.  Advanced Directives 02/04/2017  Does Patient Have a Medical Advance Directive? Yes  Type of Paramedic of [place];Living will  Copy of [place] in Chart? No - copy requested    Tobacco Social History   Tobacco Use  Smoking Status Former Smoker  Smokeless Tobacco Never Used     Counseling given: Not Answered   Clinical Intake:                       Past Medical History:  Diagnosis Date  . Hypertension    Past Surgical History:  Procedure Laterality Date  . ABDOMINAL HYSTERECTOMY    . LAPAROSCOPIC BILATERAL SALPINGO OOPHERECTOMY Bilateral 2005  . TONSILECTOMY, ADENOIDECTOMY, BILATERAL MYRINGOTOMY AND TUBES     Family History  Problem Relation Age of Onset  . Hypertension Mother   . Diabetes Maternal Uncle   . Diabetes Maternal Uncle    Social History   Socioeconomic History  . Marital status: Married    Spouse name: Not on file  . Number of children: Not on file  . Years of education: Not on file  . Highest education level: Not on file  Occupational History  . Not on file  Social Needs  . Financial resource strain: Not on file  . Food insecurity:    Worry: Not on file    Inability: Not on file  . Transportation needs:    Medical: Not on file    Non-medical: Not on file  Tobacco Use  . Smoking status: Former Research scientist (life sciences)  . Smokeless tobacco: Never Used  Substance and Sexual Activity  . Alcohol use: Yes    Comment: Occasionally  . Drug use: No  . Sexual activity: Not Currently  Lifestyle  . Physical  activity:    Days per week: Not on file    Minutes per session: Not on file  . Stress: Not on file  Relationships  . Social connections:    Talks on phone: Not on file    Gets together: Not on file    Attends religious service: Not on file    Active member of club or organization: Not on file    Attends meetings of clubs or organizations: Not on file    Relationship status: Not on file  Other Topics Concern  . Not on file  Social History Narrative  . Not on file    Outpatient Encounter Medications as of 12/04/2018  Medication Sig  . amLODipine (NORVASC) 10 MG tablet Take 1 tablet (10 mg total) by mouth daily.  Marland Kitchen co-enzyme Q-10 30 MG capsule Take 30 mg by mouth daily.  . hydrochlorothiazide (HYDRODIURIL) 25 MG tablet Take 1 tablet (25 mg total) by mouth daily.  Marland Kitchen losartan (COZAAR) 100 MG tablet Take 1 tablet (100 mg total) by mouth daily.  . traZODone (DESYREL) 50 MG tablet Take 1 tablet (50 mg total) by mouth at bedtime.  . [DISCONTINUED] amLODipine (NORVASC) 10 MG tablet  Take 1 tablet (10 mg total) by mouth daily.  . [DISCONTINUED] hydrochlorothiazide (HYDRODIURIL) 25 MG tablet TAKE 1 TABLET BY MOUTH EVERY DAY  . [DISCONTINUED] losartan (COZAAR) 100 MG tablet TAKE 1 TABLET BY MOUTH EVERY DAY   No facility-administered encounter medications on file as of 12/04/2018.     Activities of Daily Living In your present state of health, do you have any difficulty performing the following activities: 12/04/2018  Hearing? N  Vision? N  Difficulty concentrating or making decisions? Y  Comment per patient she forgets things her husband tells her.   Walking or climbing stairs? N  Dressing or bathing? N  Doing errands, shopping? N  Some recent data might be hidden    Patient Care Team: Lavada Mesi as PCP - General (Family Medicine)    Assessment:   This is a routine wellness examination for [place].  Exercise Activities and Dietary recommendations    Goals    .  Increase water intake     Starting 02/04/17, I will attempt to increase my water intake to 3 glasses per day.        Fall Risk Fall Risk  12/04/2018 02/04/2017 05/31/2016  Falls in the past year? 0 No No   Is the patient's home free of loose throw rugs in walkways, pet beds, electrical cords, etc?   yes      Grab bars in the bathroom? no      Handrails on the stairs?   yes      Adequate lighting?   yes  Timed Get Up and Go performed:   Depression Screen PHQ 2/9 Scores 12/04/2018 07/11/2017 02/04/2017 05/31/2016  PHQ - 2 Score 0 0 1 0  PHQ- 9 Score - 0 - -     Cognitive Function MMSE - Mini Mental State Exam 02/04/2017  Orientation to time 5  Orientation to Place 5  Registration 3  Attention/ Calculation 5  Recall 3  Language- name 2 objects 2  Language- repeat 1  Language- follow 3 step command 3  Language- read & follow direction 1  Write a sentence 1  Copy design 1  Total score 30     6CIT Screen 12/04/2018 12/04/2018  What Year? 0 points 0 points  What month? 0 points 0 points  What time? 0 points 0 points  Count back from 20 0 points 0 points  Months in reverse 0 points 0 points  Repeat phrase 0 points 2 points  Total Score 0 2    Immunization History  Administered Date(s) Administered  . Influenza, High Dose Seasonal PF 07/11/2017  . Influenza,inj,Quad PF,6+ Mos 08/01/2013, 12/02/2015  . Influenza-Unspecified 09/18/2014, 08/25/2016, 08/29/2018  . Pneumococcal Conjugate-13 05/13/2014  . Pneumococcal Polysaccharide-23 05/31/2016  . Tdap 11/05/2014  . Zoster Recombinat (Shingrix) 12/04/2018    Qualifies for Shingles Vaccine?yes, discussed shingrix. First dose to be given today.   Screening Tests Health Maintenance  Topic Date Due  . MAMMOGRAM  03/01/2018  . TETANUS/TDAP  11/05/2024  . INFLUENZA VACCINE  Completed  . DEXA SCAN  Completed  . PNA vac Low Risk Adult  Completed    Cancer Screenings: Lung: Low Dose CT Chest recommended if Age 64-80 years, 30  pack-year currently smoking OR have quit w/in 15years. Patient does not qualify. Breast:  Up to date on Mammogram? No   Up to date of Bone Density/Dexa? No Colorectal: yes  Additional Screenings: done: Hepatitis C Screening:      Plan:  I have personally reviewed and noted the following in the patient's chart:   . Medical and social history . Use of alcohol, tobacco or illicit drugs  . Current medications and supplements . Functional ability and status . Nutritional status . Physical activity . Advanced directives . List of other physicians . Hospitalizations, surgeries, and ER visits in previous 12 months . Vitals . Screenings to include cognitive, depression, and falls . Referrals and appointments  In addition, I have reviewed and discussed with patient certain preventive protocols, quality metrics, and best practice recommendations. A written personalized care plan for preventive services as well as general preventive health recommendations were provided to patient.    First dose of shingrix given today. Follow up in 2 months.   Right carotid bruit heard today. Will get doppler.   Mammogram and dexa ordered today.    [name], PA-C  12/04/2018

## 2018-12-05 LAB — COMPLETE METABOLIC PANEL WITH GFR
AG Ratio: 1.4 (calc) (ref 1.0–2.5)
ALT: 15 U/L (ref 6–29)
AST: 20 U/L (ref 10–35)
Albumin: 4.3 g/dL (ref 3.6–5.1)
Alkaline phosphatase (APISO): 63 U/L (ref 33–130)
BUN/Creatinine Ratio: 20 (calc) (ref 6–22)
BUN: 19 mg/dL (ref 7–25)
CO2: 27 mmol/L (ref 20–32)
CREATININE: 0.96 mg/dL — AB (ref 0.60–0.93)
Calcium: 9.4 mg/dL (ref 8.6–10.4)
Chloride: 103 mmol/L (ref 98–110)
GFR, EST NON AFRICAN AMERICAN: 57 mL/min/{1.73_m2} — AB (ref >=60)
GFR, Est African American: 66 mL/min/{1.73_m2} (ref >=60)
Globulin: 3 g/dL (calc) (ref 1.9–3.7)
Glucose, Bld: 76 mg/dL (ref 65–99)
Potassium: 3.5 mmol/L (ref 3.5–5.3)
Sodium: 140 mmol/L (ref 135–146)
Total Bilirubin: 0.5 mg/dL (ref 0.2–1.2)
Total Protein: 7.3 g/dL (ref 6.1–8.1)

## 2018-12-05 LAB — LIPID PANEL W/REFLEX DIRECT LDL
Cholesterol: 200 mg/dL — ABNORMAL HIGH (ref <200)
HDL: 81 mg/dL (ref >50)
LDL Cholesterol (Calc): 105 mg/dL (calc) — ABNORMAL HIGH
Non-HDL Cholesterol (Calc): 119 mg/dL (calc) (ref <130)
Total CHOL/HDL Ratio: 2.5 (calc) (ref <5.0)
Triglycerides: 47 mg/dL (ref <150)

## 2018-12-05 NOTE — Telephone Encounter (Signed)
Pt advised. She will be on the lookout for a bill from her insurance. Advised they may not cover the vaccine or only cover partial cost. The second vaccine may need to be sent to the pharmacy for administration.

## 2018-12-05 NOTE — Progress Notes (Signed)
Call pt: HDL great. LDL good. TG great. Liver looks great.  Kidney function went up just a tad but GFR still good. No concerns. Will continue to monitor and recheck in 6 months.  Labs look fantastic.

## 2018-12-07 ENCOUNTER — Ambulatory Visit (HOSPITAL_BASED_OUTPATIENT_CLINIC_OR_DEPARTMENT_OTHER): Payer: Medicare Other

## 2018-12-07 ENCOUNTER — Ambulatory Visit (HOSPITAL_BASED_OUTPATIENT_CLINIC_OR_DEPARTMENT_OTHER)
Admission: RE | Admit: 2018-12-07 | Discharge: 2018-12-07 | Disposition: A | Discharge status: Home / Self Care | Payer: Medicare Other | Source: Ambulatory Visit | Attending: Physician Assistant | Admitting: Physician Assistant

## 2018-12-07 DIAGNOSIS — R0989 Other specified symptoms and signs involving the circulatory and respiratory systems: Secondary | ICD-10-CM | POA: Diagnosis not present

## 2018-12-07 DIAGNOSIS — I6523 Occlusion and stenosis of bilateral carotid arteries: Secondary | ICD-10-CM | POA: Diagnosis not present

## 2018-12-08 NOTE — Addendum Note (Signed)
Addended byDonella Stade on: 12/08/2018 06:00 PM   Modules accepted: Orders

## 2018-12-08 NOTE — Progress Notes (Signed)
Call pt: no significant stenosis of the carotids which is great news. 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 We need to get a thyroid u/s to fully evaluation.

## 2018-12-11 NOTE — Progress Notes (Signed)
Done

## 2018-12-12 ENCOUNTER — Ambulatory Visit (HOSPITAL_BASED_OUTPATIENT_CLINIC_OR_DEPARTMENT_OTHER)
Admission: RE | Admit: 2018-12-12 | Discharge: 2018-12-12 | Disposition: A | Discharge status: Home / Self Care | Payer: Medicare Other | Source: Ambulatory Visit | Attending: Physician Assistant | Admitting: Physician Assistant

## 2018-12-12 DIAGNOSIS — E041 Nontoxic single thyroid nodule: Secondary | ICD-10-CM | POA: Insufficient documentation

## 2018-12-13 ENCOUNTER — Encounter: Payer: Self-pay | Admitting: Physician Assistant

## 2018-12-13 ENCOUNTER — Ambulatory Visit: Payer: Self-pay

## 2018-12-13 DIAGNOSIS — E041 Nontoxic single thyroid nodule: Secondary | ICD-10-CM | POA: Insufficient documentation

## 2018-12-13 NOTE — Progress Notes (Signed)
Call pt: nodule confirmed. 1 year follow up recommended.

## 2018-12-19 ENCOUNTER — Other Ambulatory Visit: Payer: Self-pay | Admitting: Physician Assistant

## 2018-12-19 DIAGNOSIS — F5101 Primary insomnia: Secondary | ICD-10-CM

## 2018-12-19 NOTE — Telephone Encounter (Signed)
Last RF 12/19/17  Last OV 12/04/18  RX pended

## 2018-12-20 ENCOUNTER — Ambulatory Visit (INDEPENDENT_AMBULATORY_CARE_PROVIDER_SITE_OTHER): Payer: Medicare Other

## 2018-12-20 DIAGNOSIS — Z1231 Encounter for screening mammogram for malignant neoplasm of breast: Secondary | ICD-10-CM | POA: Diagnosis not present

## 2018-12-20 DIAGNOSIS — M85832 Other specified disorders of bone density and structure, left forearm: Secondary | ICD-10-CM | POA: Diagnosis not present

## 2018-12-22 ENCOUNTER — Encounter: Payer: Self-pay | Admitting: Physician Assistant

## 2018-12-22 DIAGNOSIS — M8580 Other specified disorders of bone density and structure, unspecified site: Secondary | ICD-10-CM | POA: Insufficient documentation

## 2018-12-22 NOTE — Progress Notes (Signed)
Call pt: she is osteopenic which is low bone mass. Make sure on vitamin D at least 1000 units and calcium 1300mg (4 servings). Recheck in 2 years.

## 2018-12-22 NOTE — Progress Notes (Signed)
Call pt: normal mammogram. Follow up in 1 year.

## 2019-01-19 ENCOUNTER — Other Ambulatory Visit: Payer: Self-pay | Admitting: Physician Assistant

## 2019-01-19 DIAGNOSIS — I10 Essential (primary) hypertension: Secondary | ICD-10-CM

## 2020-01-14 ENCOUNTER — Encounter: Payer: Self-pay | Admitting: Physician Assistant

## 2020-01-14 ENCOUNTER — Ambulatory Visit (INDEPENDENT_AMBULATORY_CARE_PROVIDER_SITE_OTHER): Payer: Medicare Other | Admitting: Physician Assistant

## 2020-01-14 VITALS — BP 142/72 | HR 76 | Ht 62.0 in | Wt 140.0 lb

## 2020-01-14 DIAGNOSIS — I10 Essential (primary) hypertension: Secondary | ICD-10-CM | POA: Diagnosis not present

## 2020-01-14 DIAGNOSIS — R0789 Other chest pain: Secondary | ICD-10-CM | POA: Diagnosis not present

## 2020-01-14 DIAGNOSIS — M25512 Pain in left shoulder: Secondary | ICD-10-CM

## 2020-01-14 DIAGNOSIS — Z1231 Encounter for screening mammogram for malignant neoplasm of breast: Secondary | ICD-10-CM

## 2020-01-14 NOTE — Progress Notes (Addendum)
Subjective:    Patient ID: Shirley James, female    DOB: 09/03/1941, 79 y.o.   MRN: [id number]  HPI  Patient is a 79 year old female who presents with brief episodes of a "shocking sensation" on the left side of her chest, that does not radiate, starting 4 days ago. She states the "shock" will last for 1 second and will occur ~3 times daily. Denies symptom onset during periods of increased excertion; she states the shocking sensation occurs at random points during the day. Patient recieved her 2nd Pfizer COVID vaccine 5 days ago. She does admit to left shoulder pain and reduced ROM following the covid vaccine.  Denies chest pain, palpitations, N/V, abdominal pain, dyspnea.  It has only happened once today and seems to be getting better.    .. Active Ambulatory Problems    Diagnosis Date Noted   Essential hypertension, benign 08/03/2013   Insomnia 08/03/2013   PND (post-nasal drip) 11/07/2013   Heart murmur, systolic 05/13/2014   Low serum vitamin B12 03/23/2015   Vitamin D deficiency 03/23/2015   Other fatigue 03/23/2015   LUQ pain 03/16/2016   Paresthesia of both hands 07/11/2017   Bilateral carpal tunnel syndrome 12/19/2017   Tinnitus of both ears 12/19/2017   Right carotid bruit 12/04/2018   Thyroid nodule 12/13/2018   Osteopenia 12/22/2018   Resolved Ambulatory Problems    Diagnosis Date Noted   Epigastric pain 03/16/2016   Diarrhea 03/16/2016   H. pylori infection 03/17/2016   Past Medical History:  Diagnosis Date   Hypertension      Review of Systems  Constitutional: Negative for chills, diaphoresis and fever.  Eyes: Negative for visual disturbance.  Respiratory: Negative for cough and shortness of breath.   Cardiovascular: Negative for chest pain and palpitations.  Gastrointestinal: Negative for abdominal pain, diarrhea, nausea and vomiting.  Musculoskeletal:       Left shoulder pain  Neurological: Negative for dizziness, numbness and headaches.   Psychiatric/Behavioral: The patient is not nervous/anxious.   All other systems reviewed and are negative.      Objective:   Physical Exam Constitutional:      General: She is not in acute distress.    Appearance: She is not diaphoretic.  Cardiovascular:     Rate and Rhythm: Normal rate and regular rhythm.     Heart sounds: Normal heart sounds.  Pulmonary:     Effort: Pulmonary effort is normal.     Breath sounds: Normal breath sounds.  Chest:     Chest wall: No swelling or tenderness.  Musculoskeletal:     Right shoulder: Normal.     Left shoulder: Decreased range of motion.     Right lower leg: No edema.     Left lower leg: No edema.  Neurological:     Mental Status: She is alert.  Psychiatric:        Mood and Affect: Mood normal. Mood is not anxious.        Behavior: Behavior normal.           Assessment & Plan:  Marland KitchenMarland KitchenAdelicia was seen today for chest pain.  Diagnoses and all orders for this visit:  Atypical chest pain -     EKG 12-Lead  Acute pain of left shoulder  Essential hypertension, benign -     amLODipine (NORVASC) 10 MG tablet; Take 1 tablet (10 mg total) by mouth daily. -     losartan-hydrochlorothiazide (HYZAAR) 100-25 MG tablet; Take 1 tablet by mouth  daily.  Visit for screening mammogram -     MM 3D SCREEN BREAST BILATERAL   EKG-NSR, no arrhthymias, no ST elevation or depression.   Reassuring PE and EKG. No tenderness over chest or with ROM to suggest musculoskelatal. No symptoms or EKG findings to suggest cardiac. Seems to be getting better with time and less frequent. Cannot rule out covid vaccine reaction. Could be some referred pain and pulling from painful left shoulder after vaccine. Ice and NSAIDs for next 2-3 days and keep shoulder moving. If pain in chest persist then follow up for more cardiac work up. If left shoulder pain persist consider follow up with Dr. Karie Schwalbe.   Refilled BP medications today.   Marland KitchenHarlon Flor PA-C, have reviewed  and agree with the above documentation in it's entirety.

## 2020-01-14 NOTE — Patient Instructions (Signed)
Ibuprofen for next 24-48 hours. Use cold compresses on chest and left shoulder.

## 2020-01-14 NOTE — Progress Notes (Deleted)
Subjective:    Patient ID: Shirley James, female    DOB: 1941/09/23, 79 y.o.   MRN: [id number]  HPI Thursday after day after covid vaccine.    Review of Systems     Objective:   Physical Exam        Assessment & Plan:

## 2020-01-15 MED ORDER — LOSARTAN POTASSIUM-HCTZ 100-25 MG PO TABS: 1.0000 | ORAL_TABLET | Freq: Every day | ORAL | 3 refills | Status: DC

## 2020-01-15 MED ORDER — AMLODIPINE BESYLATE 10 MG PO TABS: 10.0000 mg | ORAL_TABLET | Freq: Every day | ORAL | 3 refills | Status: DC

## 2020-03-12 ENCOUNTER — Other Ambulatory Visit: Payer: Self-pay

## 2020-03-12 ENCOUNTER — Ambulatory Visit (INDEPENDENT_AMBULATORY_CARE_PROVIDER_SITE_OTHER): Payer: Medicare Other

## 2020-03-12 DIAGNOSIS — Z1231 Encounter for screening mammogram for malignant neoplasm of breast: Secondary | ICD-10-CM | POA: Diagnosis not present

## 2020-03-12 NOTE — Progress Notes (Signed)
Normal mammogram. Follow up in 1 year.

## 2020-10-20 DIAGNOSIS — I10 Essential (primary) hypertension: Secondary | ICD-10-CM | POA: Diagnosis not present

## 2020-10-20 DIAGNOSIS — G3184 Mild cognitive impairment, so stated: Secondary | ICD-10-CM | POA: Diagnosis not present

## 2020-10-20 DIAGNOSIS — E041 Nontoxic single thyroid nodule: Secondary | ICD-10-CM | POA: Diagnosis not present

## 2020-11-19 ENCOUNTER — Other Ambulatory Visit: Payer: Self-pay | Admitting: Physician Assistant

## 2020-11-19 DIAGNOSIS — I10 Essential (primary) hypertension: Secondary | ICD-10-CM

## 2020-11-24 DIAGNOSIS — E538 Deficiency of other specified B group vitamins: Secondary | ICD-10-CM | POA: Diagnosis not present

## 2020-11-24 DIAGNOSIS — E559 Vitamin D deficiency, unspecified: Secondary | ICD-10-CM | POA: Diagnosis not present

## 2020-11-24 DIAGNOSIS — G3184 Mild cognitive impairment, so stated: Secondary | ICD-10-CM | POA: Diagnosis not present

## 2020-11-24 DIAGNOSIS — I10 Essential (primary) hypertension: Secondary | ICD-10-CM | POA: Diagnosis not present

## 2020-12-02 ENCOUNTER — Other Ambulatory Visit: Payer: Self-pay | Admitting: Physician Assistant

## 2020-12-02 MED ORDER — HYDROCHLOROTHIAZIDE 12.5 MG PO TABS: 25.0000 mg | ORAL_TABLET | Freq: Every day | ORAL | 0 refills | Status: DC

## 2020-12-02 MED ORDER — LOSARTAN POTASSIUM 100 MG PO TABS: 100.0000 mg | ORAL_TABLET | Freq: Every day | ORAL | 0 refills | Status: DC

## 2020-12-02 NOTE — Progress Notes (Signed)
Combo on back order. Sent separate rx for losaartan and HCTZ.

## 2020-12-24 ENCOUNTER — Other Ambulatory Visit: Payer: Self-pay

## 2020-12-24 ENCOUNTER — Ambulatory Visit (INDEPENDENT_AMBULATORY_CARE_PROVIDER_SITE_OTHER): Payer: Medicare HMO | Admitting: Physician Assistant

## 2020-12-24 VITALS — BP 140/88 | HR 75 | Ht 62.0 in | Wt 136.0 lb

## 2020-12-24 DIAGNOSIS — G3184 Mild cognitive impairment, so stated: Secondary | ICD-10-CM

## 2020-12-24 DIAGNOSIS — R011 Cardiac murmur, unspecified: Secondary | ICD-10-CM | POA: Diagnosis not present

## 2020-12-24 DIAGNOSIS — E538 Deficiency of other specified B group vitamins: Secondary | ICD-10-CM

## 2020-12-24 DIAGNOSIS — I10 Essential (primary) hypertension: Secondary | ICD-10-CM

## 2020-12-24 DIAGNOSIS — Z0000 Encounter for general adult medical examination without abnormal findings: Secondary | ICD-10-CM | POA: Diagnosis not present

## 2020-12-24 DIAGNOSIS — E559 Vitamin D deficiency, unspecified: Secondary | ICD-10-CM | POA: Diagnosis not present

## 2020-12-24 MED ORDER — CYANOCOBALAMIN 1000 MCG/ML IJ SOLN
1000.0000 ug | Freq: Once | INTRAMUSCULAR | Status: AC
  Administered 2020-12-24: 1000 ug via INTRAMUSCULAR

## 2020-12-24 NOTE — Patient Instructions (Addendum)
B12 1040mcg oral daily.  Namenda?? Adding to aricept  Management of Memory Problems  There are some general things you can do to help manage your memory problems.  Your memory may not in fact recover, but by using techniques and strategies you will be able to manage your memory difficulties better.  1)  Establish a routine.  Try to establish and then stick to a regular routine.  By doing this, you will get used to what to expect and you will reduce the need to rely on your memory.  Also, try to do things at the same time of day, such as taking your medication or checking your calendar first thing in the morning.  Think about think that you can do as a part of a regular routine and make a list.  Then enter them into a daily planner to remind you.  This will help you establish a routine.  2)  Organize your environment.  Organize your environment so that it is uncluttered.  Decrease visual stimulation.  Place everyday items such as keys or cell phone in the same place every day (ie.  Basket next to front door)  Use post it notes with a brief message to yourself (ie. Turn off light, lock the door)  Use labels to indicate where things go (ie. Which cupboards are for food, dishes, etc.)  Keep a notepad and pen by the telephone to take messages  3)  Memory Aids  A diary or journal/notebook/daily planner  Making a list (shopping list, chore list, to do list that needs to be done)  Using an alarm as a reminder (kitchen timer or cell phone alarm)  Using cell phone to store information (Notes, Calendar, Reminders)  Calendar/White board placed in a prominent position  Post-it notes  In order for memory aids to be useful, you need to have good habits.  It's no good remembering to make a note in your journal if you don't remember to look in it.  Try setting aside a certain time of day to look in journal.  4)  Improving mood and managing fatigue.  There may be other factors that contribute  to memory difficulties.  Factors, such as anxiety, depression and tiredness can affect memory.  Regular gentle exercise can help improve your mood and give you more energy.  Simple relaxation techniques may help relieve symptoms of anxiety  Try to get back to completing activities or hobbies you enjoyed doing in the past.  Learn to pace yourself through activities to decrease fatigue.  Find out about some local support groups where you can share experiences with others.  Try and achieve 7-8 hours of sleep at night.

## 2020-12-24 NOTE — Progress Notes (Signed)
Subjective:    Patient ID: Shirley James, female    DOB: 1941-05-07, 80 y.o.   MRN: [id number]  HPI  Pt is a 80 yo female with HTN, insomnia, vitamin D and b12 deficiency with amnesia and mild cognitive impairment who presents to the clinic for follow up. Pt is alone today.   She has not been seen in our office in a while due to being in a [facility] study for cognitive impairment and seeing a geriatric specialist there. She is receiving a IV study drug but she does not feel it is helping her memory and she is concerned. She was started on aricept but not able to tolerate 10mg  dosing due to ringing in her ears but taking the 5mg  dosing.   She is noticing more and more things that she is forgetting. It makes her husband and daughters upset.   Hx of b12 low but patient does not feel like she is taking b12.   .. Active Ambulatory Problems    Diagnosis Date Noted  . Essential hypertension, benign 08/03/2013  . Insomnia 08/03/2013  . PND (post-nasal drip) 11/07/2013  . Heart murmur, systolic 05/13/2014  . B12 deficiency 03/23/2015  . Vitamin D deficiency 03/23/2015  . Other fatigue 03/23/2015  . LUQ pain 03/16/2016  . Paresthesia of both hands 07/11/2017  . Bilateral carpal tunnel syndrome 12/19/2017  . 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 Right carotid bruit 12/04/2018  . Thyroid nodule 12/13/2018  . Osteopenia 12/22/2018   Resolved Ambulatory Problems    Diagnosis Date Noted  . Epigastric pain 03/16/2016  . Diarrhea 03/16/2016  . H. pylori infection 03/17/2016   Past Medical History:  Diagnosis Date  . Hypertension       Review of Systems See HPI.     Objective:   Physical Exam Vitals reviewed.  Constitutional:      Appearance: Normal appearance.  HENT:     Head: Normocephalic.  Cardiovascular:     Rate and Rhythm: Normal rate and regular rhythm.     Pulses: Normal pulses.     Heart sounds: Murmur heard.    Pulmonary:     Effort: Pulmonary effort is normal.      Breath sounds: Normal breath sounds.  Neurological:     General: No focal deficit present.     Mental Status: She is alert and oriented to person, place, and time.  Psychiatric:        Mood and Affect: Mood normal.           Assessment & Plan:  Marland KitchenMarland KitchenDaphnie was seen today for annual exam.  Diagnoses and all orders for this visit:  Preventative health care  Essential hypertension, benign  Heart murmur, systolic  Vitamin D deficiency  B12 deficiency -     cyanocobalamin ((VITAMIN B-12)) injection 1,000 mcg  Amnestic MCI (mild cognitive impairment with memory loss) -     Ambulatory referral to Neurology   Labs are UTD in EMR. Reviewed.  Discussed low b12 with patient. IM b12 shot given today. Discussed to start b12 daily to see if that will help with memory.  Discussed tips for memory and how it is not treatable but different things can slow progression.  Discussed adding namenda but I will defer to geriatric specialist and [facility] study group.  I read last notes with [facility] that suggested a memory counseling referral. I placed referral today and encouraged patient to go.   Mammogram UTD.  Bone density UTD. Pneumonia/covid/flu  UTD.   BP to goal. Medications refilled.  Colonoscopy UTD.

## 2020-12-29 ENCOUNTER — Ambulatory Visit (INDEPENDENT_AMBULATORY_CARE_PROVIDER_SITE_OTHER): Payer: Medicare HMO | Admitting: Physician Assistant

## 2020-12-29 ENCOUNTER — Other Ambulatory Visit: Payer: Self-pay | Admitting: Neurology

## 2020-12-29 DIAGNOSIS — Z0000 Encounter for general adult medical examination without abnormal findings: Secondary | ICD-10-CM

## 2020-12-29 DIAGNOSIS — Z13820 Encounter for screening for osteoporosis: Secondary | ICD-10-CM

## 2020-12-29 DIAGNOSIS — Z1231 Encounter for screening mammogram for malignant neoplasm of breast: Secondary | ICD-10-CM

## 2020-12-29 DIAGNOSIS — G3184 Mild cognitive impairment, so stated: Secondary | ICD-10-CM | POA: Insufficient documentation

## 2020-12-29 DIAGNOSIS — Z1159 Encounter for screening for other viral diseases: Secondary | ICD-10-CM

## 2020-12-29 MED ORDER — LOSARTAN POTASSIUM-HCTZ 100-25 MG PO TABS
1.0000 | ORAL_TABLET | Freq: Every day | ORAL | 3 refills | Status: DC
Start: 2020-12-29 — End: 2021-06-23

## 2020-12-29 MED ORDER — AMLODIPINE BESYLATE 10 MG PO TABS
10.0000 mg | ORAL_TABLET | Freq: Every day | ORAL | 3 refills | Status: DC
Start: 2020-12-29 — End: 2022-01-07

## 2020-12-29 NOTE — Patient Instructions (Addendum)
Bone Density Test A bone density test uses a type of X-ray to measure the amount of calcium and other minerals in a person's bones. It can measure bone density in the hip and the spine. The test is similar to having a regular X-ray. This test may also be called:  Bone densitometry.  Bone mineral density test.  Dual-energy X-ray absorptiometry (DEXA). You may have this test to:  Diagnose a condition that causes weak or thin bones (osteoporosis).  Screen you for osteoporosis.  Predict your risk for a broken bone (fracture).  Determine how well your osteoporosis treatment is working. Tell a health care provider about:  Any allergies you have.  All medicines you are taking, including vitamins, herbs, eye drops, creams, and over-the-counter medicines.  Any problems you or family members have had with anesthetic medicines.  Any blood disorders you have.  Any surgeries you have had.  Any medical conditions you have.  Whether you are pregnant or may be pregnant.  Any medical tests you have had within the past 14 days that used contrast material. What are the risks? Generally, this is a safe test. However, it does expose you to a small amount of radiation, which can slightly increase your cancer risk. What happens before the test?  Do not take any calcium supplements within the 24 hours before your test.  You will need to remove all metal jewelry, eyeglasses, removable dental appliances, and any other metal objects on your body. What happens during the test?  You will lie down on an exam table. There will be an X-ray generator below you and an imaging device above you.  Other devices, such as boxes or braces, may be used to position your body properly for the scan.  The machine will slowly scan your body. You will need to keep very still while the machine does the scan.  The images will show up on a screen in the room. Images will be examined by a specialist after your  test is finished. The procedure may vary among health care providers and hospitals.   What can I expect after the test? It is up to you to get the results of your test. Ask your health care provider, or the department that is doing the test, when your results will be ready. Summary  A bone density test is an imaging test that uses a type of X-ray to measure the amount of calcium and other minerals in your bones.  The test may be used to diagnose or screen you for a condition that causes weak or thin bones (osteoporosis), predict your risk for a broken bone (fracture), or determine how well your osteoporosis treatment is working.  Do not take any calcium supplements within 24 hours before your test.  Ask your health care provider, or the department that is doing the test, when your results will be ready. This information is not intended to replace advice given to you by your health care provider. Make sure you discuss any questions you have with your health care provider. Document Revised: 04/24/2020 Document Reviewed: 04/24/2020 Elsevier Patient Education  2021 Elsevier Inc.   Health Maintenance, Female Adopting a healthy lifestyle and getting preventive care are important in promoting health and wellness. Ask your health care provider about:  The right schedule for you to have regular tests and exams.  Things you can do on your own to prevent diseases and keep yourself healthy. What should I know about diet, weight, and exercise?  Eat a healthy diet  Eat a diet that includes plenty of vegetables, fruits, low-fat dairy products, and lean protein.  Do not eat a lot of foods that are high in solid fats, added sugars, or sodium.   Maintain a healthy weight Body mass index (BMI) is used to identify weight problems. It estimates body fat based on height and weight. Your health care provider can help determine your BMI and help you achieve or maintain a healthy weight. Get regular  exercise Get regular exercise. This is one of the most important things you can do for your health. Most adults should:  Exercise for at least 150 minutes each week. The exercise should increase your heart rate and make you sweat (moderate-intensity exercise).  Do strengthening exercises at least twice a week. This is in addition to the moderate-intensity exercise.  Spend less time sitting. Even light physical activity can be beneficial. Watch cholesterol and blood lipids Have your blood tested for lipids and cholesterol at 80 years of age, then have this test every 5 years. Have your cholesterol levels checked more often if:  Your lipid or cholesterol levels are high.  You are older than 80 years of age.  You are at high risk for heart disease. What should I know about cancer screening? Depending on your health history and family history, you may need to have cancer screening at various ages. This may include screening for:  Breast cancer.  Cervical cancer.  Colorectal cancer.  Skin cancer.  Lung cancer. What should I know about heart disease, diabetes, and high blood pressure? Blood pressure and heart disease  High blood pressure causes heart disease and increases the risk of stroke. This is more likely to develop in people who have high blood pressure readings, are of African descent, or are overweight.  Have your blood pressure checked: ? Every 3-5 years if you are 72-34 years of age. ? Every year if you are 56 years old or older. Diabetes Have regular diabetes screenings. This checks your fasting blood sugar level. Have the screening done:  Once every three years after age 7 if you are at a normal weight and have a low risk for diabetes.  More often and at a younger age if you are overweight or have a high risk for diabetes. What should I know about preventing infection? Hepatitis B If you have a higher risk for hepatitis B, you should be screened for this virus.  Talk with your health care provider to find out if you are at risk for hepatitis B infection. Hepatitis C Testing is recommended for:  Everyone born from [birth year] through 1965.  Anyone with known risk factors for hepatitis C. Sexually transmitted infections (STIs)  Get screened for STIs, including gonorrhea and chlamydia, if: ? You are sexually active and are younger than 80 years of age. ? You are older than 80 years of age and your health care provider tells you that you are at risk for this type of infection. ? Your sexual activity has changed since you were last screened, and you are at increased risk for chlamydia or gonorrhea. Ask your health care provider if you are at risk.  Ask your health care provider about whether you are at high risk for HIV. Your health care provider may recommend a prescription medicine to help prevent HIV infection. If you choose to take medicine to prevent HIV, you should first get tested for HIV. You should then be tested every 3 months for  as long as you are taking the medicine. Pregnancy  If you are about to stop having your period (premenopausal) and you may become pregnant, seek counseling before you get pregnant.  Take 400 to 800 micrograms (mcg) of folic acid every day if you become pregnant.  Ask for birth control (contraception) if you want to prevent pregnancy. Osteoporosis and menopause Osteoporosis is a disease in which the bones lose minerals and strength with aging. This can result in bone fractures. If you are 35 years old or older, or if you are at risk for osteoporosis and fractures, ask your health care provider if you should:  Be screened for bone loss.  Take a calcium or vitamin D supplement to lower your risk of fractures.  Be given hormone replacement therapy (HRT) to treat symptoms of menopause. Follow these instructions at home: Lifestyle  Do not use any products that contain nicotine or tobacco, such as cigarettes, e-cigarettes,  and chewing tobacco. If you need help quitting, ask your health care provider.  Do not use street drugs.  Do not share needles.  Ask your health care provider for help if you need support or information about quitting drugs. Alcohol use  Do not drink alcohol if: ? Your health care provider tells you not to drink. ? You are pregnant, may be pregnant, or are planning to become pregnant.  If you drink alcohol: ? Limit how much you use to 0-1 drink a day. ? Limit intake if you are breastfeeding.  Be aware of how much alcohol is in your drink. In the [place] one drink equals one 12 oz bottle of beer (355 mL), one 5 oz glass of wine (148 mL), or one 1 oz glass of hard liquor (44 mL). General instructions  Schedule regular health, dental, and eye exams.  Stay current with your vaccines.  Tell your health care provider if: ? You often feel depressed. ? You have ever been abused or do not feel safe at home. Summary  Adopting a healthy lifestyle and getting preventive care are important in promoting health and wellness.  Follow your health care provider's instructions about healthy diet, exercising, and getting tested or screened for diseases.  Follow your health care provider's instructions on monitoring your cholesterol and blood pressure. This information is not intended to replace advice given to you by your health care provider. Make sure you discuss any questions you have with your health care provider. Document Revised: 11/01/2018 Document Reviewed: 11/01/2018 Elsevier Patient Education  2021 Elsevier Inc.    MEDICARE [place] VISIT Health Maintenance Summary and Written Plan of Care  Shirley James ,  Thank you for allowing me to perform your Medicare Annual Wellness Visit and for your ongoing commitment to your health.   Health Maintenance & Immunization History Health Maintenance  Topic Date Due  . Hepatitis C Screening  12/29/2021 (Originally [birth year])  .  MAMMOGRAM  03/12/2021  . TETANUS/TDAP  11/05/2024  . INFLUENZA VACCINE  Completed  . DEXA SCAN  Completed  . COVID-19 Vaccine  Completed  . PNA vac Low Risk Adult  Completed   Immunization History  Administered Date(s) Administered  . Fluad Quad(high Dose 65+) 07/12/2019  . Influenza, High Dose Seasonal PF 07/11/2017, 08/29/2018, 07/12/2019, 10/20/2020  . Influenza,inj,Quad PF,6+ Mos 08/01/2013, 12/02/2015  . Influenza-Unspecified 09/18/2014, 08/25/2016, 08/29/2018  . PFIZER(Purple Top)SARS-COV-2 Vaccination 12/08/2019, 01/08/2020, 08/26/2020  . Pneumococcal Conjugate-13 05/13/2014  . Pneumococcal Polysaccharide-23 05/31/2016  . Tdap 11/05/2014  . Zoster Recombinat (Shingrix) 12/04/2018  These are the patient goals that we discussed: Goals Addressed              This Visit's Progress   .  Patient Stated (pt-stated)        12/29/2020 AWV Goal: Exercise for General Health   Patient will verbalize understanding of the benefits of increased physical activity:  Exercising regularly is important. It will improve your overall fitness, flexibility, and endurance.  Regular exercise also will improve your overall health. It can help you control your weight, reduce stress, and improve your bone density.  Over the next year, patient will increase physical activity as tolerated with a goal of at least 150 minutes of moderate physical activity per week.   You can tell that you are exercising at a moderate intensity if your heart starts beating faster and you start breathing faster but can still hold a conversation.  Moderate-intensity exercise ideas include:  Walking 1 mile (1.6 km) in about 15 minutes  Biking  Hiking  Golfing  Dancing  Water aerobics  Patient will verbalize understanding of everyday activities that increase physical activity by providing examples like the following: ? Yard work, such as: ? Pushing a Surveyor, mining ? Raking and bagging leaves ? Washing your  car ? Pushing a stroller ? Shoveling snow ? Gardening ? Washing windows or floors  Patient will be able to explain general safety guidelines for exercising:   Before you start a new exercise program, talk with your health care provider.  Do not exercise so much that you hurt yourself, feel dizzy, or get very short of breath.  Wear comfortable clothes and wear shoes with good support.  Drink plenty of water while you exercise to prevent dehydration or heat stroke.  Work out until your breathing and your heartbeat get faster.         This is a list of Health Maintenance Items that are overdue or due now: Mammogram in April 2022 Dexa scan   Orders/Referrals Placed Today: No orders of the defined types were placed in this encounter.  Follow-up Plan . Follow-up with Jomarie Longs, PA-C as planned . Referral for Mammogram and Dexa scan will be sent.  . Hepatitis screening will be done with your labs next time.

## 2020-12-29 NOTE — Progress Notes (Signed)
MEDICARE ANNUAL WELLNESS VISIT  12/29/2020  Telephone Visit Disclaimer This Medicare AWV was conducted by telephone due to national recommendations for restrictions regarding the COVID-19 Pandemic (e.g. social distancing).  I verified, using two identifiers, that I am speaking with Shirley James or their authorized healthcare agent. I discussed the limitations, risks, security, and privacy concerns of performing an evaluation and management service by telephone and the potential availability of an in-person appointment in the future. The patient expressed understanding and agreed to proceed.  Location of Patient: Home Location of Provider (nurse):  In the office  Subjective:    Shirley James is a 80 y.o. female patient of Shirley Longs, PA-C who had a Medicare Annual Wellness Visit today via telephone. Shirley James is Retired and lives with their spouse. she has 2 children. she reports that she is socially active and does interact with friends/family regularly. she is minimally physically active and enjoys walking and quilting.  Patient Care Team: Nolene Ebbs as PCP - General (Family Medicine)  Advanced Directives 12/29/2020 02/04/2017  Does Patient Have a Medical Advance Directive? No Yes  Type of Advance Directive - Healthcare Power of [place];Living will  Copy of Healthcare Power of Attorney in Chart? - No - copy requested  Would patient like information on creating a medical advance directive? No - Patient declined [facility] Utilization Over the Past 12 Months: # of hospitalizations or ER visits: 0 # of surgeries: 0  Review of Systems    Patient reports that her overall health is unchanged compared to last year.  History obtained from chart review and the patient  Patient Reported Readings (BP, Pulse, CBG, Weight, etc) none  Pain Assessment Pain : No/denies pain     Current Medications & Allergies (verified) Allergies as of 12/29/2020      Reactions    Codeine       Medication List       Accurate as of December 29, 2020  9:40 AM. If you have any questions, ask your nurse or doctor.        amLODipine 10 MG tablet Commonly known as: NORVASC Take 1 tablet (10 mg total) by mouth daily.   donepezil 5 MG tablet Commonly known as: ARICEPT Take 10 mg by mouth daily.   Ginkgo Biloba 120 MG Tabs Take 1 tablet by mouth daily.   losartan-hydrochlorothiazide 100-25 MG tablet Commonly known as: HYZAAR Take 1 tablet by mouth daily.   Vitamin D 50 MCG (2000 UT) Caps Take by mouth daily.       History (reviewed): Past Medical History:  Diagnosis Date  . Hypertension    Past Surgical History:  Procedure Laterality Date  . ABDOMINAL HYSTERECTOMY    . LAPAROSCOPIC BILATERAL SALPINGO OOPHERECTOMY Bilateral 2005  . TONSILECTOMY, ADENOIDECTOMY, BILATERAL MYRINGOTOMY AND TUBES     Family History  Problem Relation Age of Onset  . Hypertension Mother   . Diabetes Maternal Uncle   . Diabetes Maternal Uncle    Social History   Socioeconomic History  . Marital status: Married    Spouse name: Not on file  . Number of children: Not on file  . Years of education: 16  . Highest education level: Associate degree: academic program  Occupational History  . Occupation: Retired    Comment: Designer, jewellery  Tobacco Use  . Smoking status: Former Games developer  . Smokeless tobacco: Never Used  Substance and Sexual Activity  . Alcohol use: Yes  Comment: Occasionally  . Drug use: No  . Sexual activity: Not Currently  Other Topics Concern  . Not on file  Social History Narrative   Live with your husband. In her free time, she likes to walk and quilt.   Social Determinants of Health   Financial Resource Strain: Low Risk   . Difficulty of Paying Living Expenses: Not hard at all  Food Insecurity: No Food Insecurity  . Worried About Programme researcher, broadcasting/film/video in the Last Year: Never true  . Ran Out of Food in the Last Year: Never true   Transportation Needs: No Transportation Needs  . Lack of Transportation (Medical): No  . Lack of Transportation (Non-Medical): No  Physical Activity: Insufficiently Active  . Days of Exercise per Week: 7 days  . Minutes of Exercise per Session: 20 min  Stress: No Stress Concern Present  . Feeling of Stress : Not at all  Social Connections: Socially Integrated  . Frequency of Communication with Friends and Family: More than three times a week  . Frequency of Social Gatherings with Friends and Family: Once a week  . Attends Religious Services: More than 4 times per year  . Active Member of Clubs or Organizations: Yes  . Attends Banker Meetings: More than 4 times per year  . Marital Status: Married    Activities of Daily Living In your present state of health, do you have any difficulty performing the following activities: 12/29/2020  Hearing? N  Vision? N  Difficulty concentrating or making decisions? N  Walking or climbing stairs? N  Dressing or bathing? N  Doing errands, shopping? N  Preparing Food and eating ? N  Using the Toilet? N  In the past six months, have you accidently leaked urine? N  Do you have problems with loss of bowel control? N  Managing your Medications? N  Managing your Finances? N  Housekeeping or managing your Housekeeping? N  Some recent data might be hidden    Patient Education/ Literacy How often do you need to have someone help you when you read instructions, pamphlets, or other written materials from your doctor or pharmacy?: 1 - Never What is the last grade level you completed in school?: Associates Degree  Exercise Current Exercise Habits: Home exercise routine, Type of exercise: walking, Time (Minutes): 25, Frequency (Times/Week): >7, Weekly Exercise (Minutes/Week): 0, Intensity: Moderate, Exercise limited by: None identified  Diet Patient reports consuming 2 meals a day and 2 snack(s) a day Patient reports that her primary diet  is: Regular Patient reports that she does have regular access to food.   Depression Screen PHQ 2/9 Scores 12/29/2020 12/24/2020 12/04/2018 07/11/2017 02/04/2017 05/31/2016  PHQ - 2 Score 0 0 0 0 1 0  PHQ- 9 Score 0 - - 0 - -     Fall Risk Fall Risk  12/29/2020 12/04/2018 02/04/2017 05/31/2016  Falls in the past year? 0 0 No No  Number falls in past yr: 0 - - -  Injury with Fall? 0 - - -  Risk for fall due to : No Fall Risks - - -  Follow up Falls evaluation completed - - -     Objective:  Shirley James seemed alert and oriented and she participated appropriately during our telephone visit.  Blood Pressure Weight BMI  BP Readings from Last 3 Encounters:  12/24/20 140/88  01/14/20 (!) 142/72  12/04/18 137/72   Wt Readings from Last 3 Encounters:  12/24/20 136 lb (61.7  kg)  01/14/20 140 lb (63.5 kg)  12/04/18 147 lb (66.7 kg)   BMI Readings from Last 1 Encounters:  12/24/20 24.87 kg/m    *Unable to obtain current vital signs, weight, and BMI due to telephone visit type  Hearing/Vision  . Shirley James did not seem to have difficulty with hearing/understanding during the telephone conversation . Reports that she has had a formal eye exam by an eye care professional within the past year . Reports that she has not had a formal hearing evaluation within the past year *Unable to fully assess hearing and vision during telephone visit type  Cognitive Function: 6CIT Screen 12/29/2020 12/04/2018 12/04/2018  What Year? 0 points 0 points 0 points  What month? 0 points 0 points 0 points  What time? 0 points 0 points 0 points  Count back from 20 0 points 0 points 0 points  Months in reverse 0 points 0 points 0 points  Repeat phrase 0 points 0 points 2 points  Total Score 0 0 2   (Normal:0-7, Significant for Dysfunction: >8)  Normal Cognitive Function Screening: Yes   Immunization & Health Maintenance Record Immunization History  Administered Date(s) Administered  . Fluad Quad(high Dose 65+)  07/12/2019  . Influenza, High Dose Seasonal PF 07/11/2017, 08/29/2018, 07/12/2019, 10/20/2020  . Influenza,inj,Quad PF,6+ Mos 08/01/2013, 12/02/2015  . Influenza-Unspecified 09/18/2014, 08/25/2016, 08/29/2018  . PFIZER(Purple Top)SARS-COV-2 Vaccination 12/08/2019, 01/08/2020, 08/26/2020  . Pneumococcal Conjugate-13 05/13/2014  . Pneumococcal Polysaccharide-23 05/31/2016  . Tdap 11/05/2014  . Zoster Recombinat (Shingrix) 12/04/2018    Health Maintenance  Topic Date Due  . Hepatitis C Screening  12/29/2021 (Originally 19-Sep-1941)  . MAMMOGRAM  03/12/2021  . TETANUS/TDAP  11/05/2024  . INFLUENZA VACCINE  Completed  . DEXA SCAN  Completed  . COVID-19 Vaccine  Completed  . PNA vac Low Risk Adult  Completed       Assessment  This is a routine wellness examination for Shirley James.  Health Maintenance: Due or Overdue There are no preventive care reminders to display for this patient.  Shirley James does not need a referral for Community Assistance: Care Management:   no Social Work:    no Prescription Assistance:  no Nutrition/Diabetes Education:  no   Plan:  Personalized Goals Goals Addressed              This Visit's Progress   .  Patient Stated (pt-stated)        12/29/2020 AWV Goal: Exercise for General Health   Patient will verbalize understanding of the benefits of increased physical activity:  Exercising regularly is important. It will improve your overall fitness, flexibility, and endurance.  Regular exercise also will improve your overall health. It can help you control your weight, reduce stress, and improve your bone density.  Over the next year, patient will increase physical activity as tolerated with a goal of at least 150 minutes of moderate physical activity per week.   You can tell that you are exercising at a moderate intensity if your heart starts beating faster and you start breathing faster but can still hold a  conversation.  Moderate-intensity exercise ideas include:  Walking 1 mile (1.6 km) in about 15 minutes  Biking  Hiking  Golfing  Dancing  Water aerobics  Patient will verbalize understanding of everyday activities that increase physical activity by providing examples like the following: ? Yard work, such as: ? Pushing a Surveyor, mining ? Raking and bagging leaves ? Washing your car ? Pushing a  stroller ? Shoveling snow ? Gardening ? Washing windows or floors  Patient will be able to explain general safety guidelines for exercising:   Before you start a new exercise program, talk with your health care provider.  Do not exercise so much that you hurt yourself, feel dizzy, or get very short of breath.  Wear comfortable clothes and wear shoes with good support.  Drink plenty of water while you exercise to prevent dehydration or heat stroke.  Work out until your breathing and your heartbeat get faster.       Personalized Health Maintenance & Screening Recommendations  Mammogram in April 2022 Dexa scan   Lung Cancer Screening Recommended: not applicable (Low Dose CT Chest recommended if Age 54-80 years, 30 pack-year currently smoking OR have quit w/in past 15 years) Hepatitis C Screening recommended: yes HIV Screening recommended: no  Advanced Directives: Written information was not prepared per patient's request.  Referrals & Orders No orders of the defined types were placed in this encounter.   Follow-up Plan . Follow-up with Shirley Longs, PA-C as planned . Referral for Mammogram and Dexa scan will be sent.  . Hepatitis screening will be done with your labs next time.   I have personally reviewed and noted the following in the patient's chart:   . Medical and social history . Use of alcohol, tobacco or illicit drugs  . Current medications and supplements . Functional ability and status . Nutritional status . Physical activity . Advanced  directives . List of other physicians . Hospitalizations, surgeries, and ER visits in previous 12 months . Vitals . Screenings to include cognitive, depression, and falls . Referrals and appointments  In addition, I have reviewed and discussed with Shirley James certain preventive protocols, quality metrics, and best practice recommendations. A written personalized care plan for preventive services as well as general preventive health recommendations is available and can be mailed to the patient at her request.      Modesto Charon, RN  12/29/2020

## 2021-01-26 DIAGNOSIS — I10 Essential (primary) hypertension: Secondary | ICD-10-CM | POA: Diagnosis not present

## 2021-01-26 DIAGNOSIS — G3184 Mild cognitive impairment, so stated: Secondary | ICD-10-CM | POA: Diagnosis not present

## 2021-01-26 DIAGNOSIS — E538 Deficiency of other specified B group vitamins: Secondary | ICD-10-CM | POA: Diagnosis not present

## 2021-01-26 DIAGNOSIS — E559 Vitamin D deficiency, unspecified: Secondary | ICD-10-CM | POA: Diagnosis not present

## 2021-02-05 ENCOUNTER — Telehealth: Payer: Self-pay | Admitting: Neurology

## 2021-02-05 NOTE — Telephone Encounter (Signed)
Received note from Neurologist. They have contacted patient to schedule new appt on 01/06/2021, 01/16/2021, and 01/27/2021. A letter was also sent to patient on 01/27/2021, patient has not scheduled neurology appt.

## 2021-02-27 ENCOUNTER — Telehealth: Payer: Self-pay | Admitting: Neurology

## 2021-02-27 NOTE — Telephone Encounter (Signed)
Patient called to schedule mammogram and bone density scan.  Called back and let her know number to imaging in our building to schedule. She expressed appreciation.

## 2021-03-04 ENCOUNTER — Encounter: Payer: Self-pay | Admitting: Physician Assistant

## 2021-03-04 ENCOUNTER — Ambulatory Visit (INDEPENDENT_AMBULATORY_CARE_PROVIDER_SITE_OTHER): Payer: Medicare HMO | Admitting: Physician Assistant

## 2021-03-04 ENCOUNTER — Other Ambulatory Visit: Payer: Self-pay

## 2021-03-04 VITALS — BP 140/57 | HR 88 | Ht 62.0 in | Wt 139.0 lb

## 2021-03-04 DIAGNOSIS — Z1159 Encounter for screening for other viral diseases: Secondary | ICD-10-CM | POA: Diagnosis not present

## 2021-03-04 DIAGNOSIS — E538 Deficiency of other specified B group vitamins: Secondary | ICD-10-CM | POA: Diagnosis not present

## 2021-03-04 MED ORDER — CYANOCOBALAMIN 1000 MCG/ML IJ SOLN
1000.0000 ug | Freq: Once | INTRAMUSCULAR | Status: AC
  Administered 2021-03-04: 1000 ug via INTRAMUSCULAR

## 2021-03-04 NOTE — Patient Instructions (Signed)
b12 107mcg sublingual. 3 months recheck.

## 2021-03-04 NOTE — Progress Notes (Signed)
Subjective:    Patient ID: Shirley James, female    DOB: 05-03-1941, 80 y.o.   MRN: [id number]  HPI  Pt is a 80 yo female with MCI, HTN, b12 deficiency who present to the clinic for follow up.   She has no concerns or complaints but was told to make follow up.   She had labs drawn in march with [facility]. B12 was 215. Hx of B12 deficiency.   .. Active Ambulatory Problems    Diagnosis Date Noted  . Essential hypertension, benign 08/03/2013  . Insomnia 08/03/2013  . PND (post-nasal drip) 11/07/2013  . Heart murmur, systolic 05/13/2014  . B12 deficiency 03/23/2015  . Vitamin D deficiency 03/23/2015  . Other fatigue 03/23/2015  . LUQ pain 03/16/2016  . Paresthesia of both hands 07/11/2017  . Bilateral carpal tunnel syndrome 12/19/2017  . 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 Right carotid bruit 12/04/2018  . Thyroid nodule 12/13/2018  . Osteopenia 12/22/2018  . 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 Epigastric pain 03/16/2016  . Diarrhea 03/16/2016  . H. pylori infection 03/17/2016   Past Medical History:  Diagnosis Date  . Hypertension        Review of Systems  All other systems reviewed and are negative.      Objective:   Physical Exam Vitals reviewed.  Constitutional:      Appearance: Normal appearance.  HENT:     Head: Normocephalic.  Cardiovascular:     Rate and Rhythm: Normal rate.     Pulses: Normal pulses.  Pulmonary:     Effort: Pulmonary effort is normal.     Breath sounds: Normal breath sounds.  Neurological:     General: No focal deficit present.     Mental Status: She is alert and oriented to person, place, and time.  Psychiatric:        Mood and Affect: Mood normal.           Assessment & Plan:  Marland KitchenMarland KitchenTariyah was seen today for follow-up.  Diagnoses and all orders for this visit:  B12 deficiency  Encounter for hepatitis C screening test for low  risk patient -     Hepatitis C Antibody   Labs up to date except Hep C screening.  Ordered to have drawn with next set of labs.   b12 215 in EMR. Still low. 1000mg  IM shot given today. Follow up monthy. Recheck in 3 months. Start OTC daily.

## 2021-03-05 ENCOUNTER — Other Ambulatory Visit: Payer: Self-pay | Admitting: Physician Assistant

## 2021-03-10 ENCOUNTER — Other Ambulatory Visit: Payer: Self-pay | Admitting: Physician Assistant

## 2021-03-18 ENCOUNTER — Ambulatory Visit (INDEPENDENT_AMBULATORY_CARE_PROVIDER_SITE_OTHER): Payer: Medicare HMO

## 2021-03-18 ENCOUNTER — Other Ambulatory Visit: Payer: Self-pay

## 2021-03-18 DIAGNOSIS — Z13820 Encounter for screening for osteoporosis: Secondary | ICD-10-CM

## 2021-03-18 DIAGNOSIS — Z1231 Encounter for screening mammogram for malignant neoplasm of breast: Secondary | ICD-10-CM | POA: Diagnosis not present

## 2021-03-18 DIAGNOSIS — Z780 Asymptomatic menopausal state: Secondary | ICD-10-CM | POA: Diagnosis not present

## 2021-03-18 NOTE — Progress Notes (Signed)
Continues to show low bone mass osteopenia but no osteoporosis at this time. Continue vitamin D and calcium OTC supplementation.

## 2021-03-18 NOTE — Progress Notes (Signed)
There is a some left breast asymmetry and needs more imaging. Imaging center will be reaching out to schedule.

## 2021-03-19 ENCOUNTER — Other Ambulatory Visit: Payer: Self-pay | Admitting: Physician Assistant

## 2021-03-19 DIAGNOSIS — R928 Other abnormal and inconclusive findings on diagnostic imaging of breast: Secondary | ICD-10-CM

## 2021-03-27 ENCOUNTER — Telehealth: Payer: Self-pay | Admitting: Neurology

## 2021-03-27 NOTE — Telephone Encounter (Signed)
Patient called back about MAMMOGRAM results. Gave her results from [facility]:   There is a some left breast asymmetry and needs more imaging. Imaging center will be reaching out to schedule.   She expressed understanding.

## 2021-04-01 ENCOUNTER — Other Ambulatory Visit: Payer: Self-pay

## 2021-04-01 ENCOUNTER — Ambulatory Visit (INDEPENDENT_AMBULATORY_CARE_PROVIDER_SITE_OTHER): Payer: Medicare HMO | Admitting: Physician Assistant

## 2021-04-01 VITALS — BP 140/56 | HR 87

## 2021-04-01 DIAGNOSIS — E538 Deficiency of other specified B group vitamins: Secondary | ICD-10-CM | POA: Diagnosis not present

## 2021-04-01 MED ORDER — CYANOCOBALAMIN 1000 MCG/ML IJ SOLN
1000.0000 ug | Freq: Once | INTRAMUSCULAR | Status: AC
  Administered 2021-04-01: 1000 ug via INTRAMUSCULAR

## 2021-04-01 NOTE — Progress Notes (Signed)
Established Patient Office Visit  Subjective:  Patient ID: Shirley James, female    DOB: Jun 06, 1941  Age: 80 y.o. MRN: [id number]  CC:  Chief Complaint  Patient presents with  . Pernicious Anemia    HPI Shirley James is here for a vitamin B 12 injection. Denies muscle cramps, weakness or irregular heart rate.   Past Medical History:  Diagnosis Date  . Hypertension     Past Surgical History:  Procedure Laterality Date  . ABDOMINAL HYSTERECTOMY    . LAPAROSCOPIC BILATERAL SALPINGO OOPHERECTOMY Bilateral 2005  . TONSILECTOMY, ADENOIDECTOMY, BILATERAL MYRINGOTOMY AND TUBES      Family History  Problem Relation Age of Onset  . Hypertension Mother   . Diabetes Maternal Uncle   . Diabetes Maternal Uncle     Social History   Socioeconomic History  . Marital status: Married    Spouse name: Not on file  . Number of children: Not on file  . Years of education: 36  . Highest education level: Associate degree: academic program  Occupational History  . Occupation: Retired    Comment: Designer, jewellery  Tobacco Use  . Smoking status: Former Games developer  . Smokeless tobacco: Never Used  Substance and Sexual Activity  . Alcohol use: Yes    Comment: Occasionally  . Drug use: No  . Sexual activity: Not Currently  Other Topics Concern  . Not on file  Social History Narrative   Live with your husband. In her free time, she likes to walk and quilt.   Social Determinants of Health   Financial Resource Strain: Low Risk   . Difficulty of Paying Living Expenses: Not hard at all  Food Insecurity: No Food Insecurity  . Worried About Programme researcher, broadcasting/film/video in the Last Year: Never true  . Ran Out of Food in the Last Year: Never true  Transportation Needs: No Transportation Needs  . Lack of Transportation (Medical): No  . Lack of Transportation (Non-Medical): No  Physical Activity: Insufficiently Active  . Days of Exercise per Week: 7 days  . Minutes of Exercise per Session: 20  min  Stress: No Stress Concern Present  . Feeling of Stress : Not at all  Social Connections: Socially Integrated  . Frequency of Communication with Friends and Family: More than three times a week  . Frequency of Social Gatherings with Friends and Family: Once a week  . Attends Religious Services: More than 4 times per year  . Active Member of Clubs or Organizations: Yes  . Attends Banker Meetings: More than 4 times per year  . Marital Status: Married  Catering manager Violence: Not At Risk  . Fear of Current or Ex-Partner: No  . Emotionally Abused: No  . Physically Abused: No  . Sexually Abused: No    Outpatient Medications Prior to Visit  Medication Sig Dispense Refill  . amLODipine (NORVASC) 10 MG tablet Take 1 tablet (10 mg total) by mouth daily. 90 tablet 3  . Cholecalciferol (VITAMIN D) 50 MCG (2000 UT) CAPS Take by mouth daily.    Marland Kitchen donepezil (ARICEPT) 5 MG tablet Take 10 mg by mouth daily.    . Ginkgo Biloba 120 MG TABS Take 1 tablet by mouth daily.    Marland Kitchen losartan (COZAAR) 100 MG tablet TAKE 1 TABLET BY MOUTH EVERY DAY 90 tablet 1  . losartan-hydrochlorothiazide (HYZAAR) 100-25 MG tablet Take 1 tablet by mouth daily. 90 tablet 3   No facility-administered medications prior to visit.  Allergies  Allergen Reactions  . Codeine     ROS Review of Systems    Objective:    Physical Exam  BP (!) 153/59   Pulse 87   SpO2 100%  Wt Readings from Last 3 Encounters:  03/04/21 139 lb (63 kg)  12/24/20 136 lb (61.7 kg)  01/14/20 140 lb (63.5 kg)     There are no preventive care reminders to display for this patient.  There are no preventive care reminders to display for this patient.  Lab Results  Component Value Date   TSH 1.58 12/23/2017   Lab Results  Component Value Date   WBC 3.6 (L) 12/23/2017   HGB 13.1 12/23/2017   HCT 37.8 12/23/2017   MCV 90.0 12/23/2017   PLT 335 12/23/2017   Lab Results  Component Value Date   NA 140  12/04/2018   K 3.5 12/04/2018   CO2 27 12/04/2018   GLUCOSE 76 12/04/2018   BUN 19 12/04/2018   CREATININE 0.96 (H) 12/04/2018   BILITOT 0.5 12/04/2018   ALKPHOS 72 06/03/2016   AST 20 12/04/2018   ALT 15 12/04/2018   PROT 7.3 12/04/2018   ALBUMIN 4.2 06/03/2016   CALCIUM 9.4 12/04/2018   Lab Results  Component Value Date   CHOL 200 (H) 12/04/2018   Lab Results  Component Value Date   HDL 81 12/04/2018   Lab Results  Component Value Date   LDLCALC 105 (H) 12/04/2018   Lab Results  Component Value Date   TRIG 47 12/04/2018   Lab Results  Component Value Date   CHOLHDL 2.5 12/04/2018   No results found for: HGBA1C    Assessment & Plan:  B12 deficiency - Patient tolerated injection well without complications. Patient advised to schedule next injection 30 days from today.    Problem List Items Addressed This Visit    B12 deficiency - Primary      Meds ordered this encounter  Medications  . cyanocobalamin ((VITAMIN B-12)) injection 1,000 mcg    Follow-up: Return in about 4 weeks (around 04/29/2021) for B12 injection. Earna Coder, Janalyn Harder, CMA

## 2021-04-01 NOTE — Progress Notes (Signed)
Patient ID: Shirley James, female   DOB: Aug 24, 1941, 80 y.o.   MRN: [id number] Agree with above plan.

## 2021-04-21 ENCOUNTER — Ambulatory Visit
Admission: RE | Admit: 2021-04-21 | Discharge: 2021-04-21 | Disposition: A | Discharge status: Home / Self Care | Payer: Medicare HMO | Source: Ambulatory Visit | Attending: Physician Assistant | Admitting: Physician Assistant

## 2021-04-21 ENCOUNTER — Other Ambulatory Visit: Payer: Self-pay

## 2021-04-21 DIAGNOSIS — R928 Other abnormal and inconclusive findings on diagnostic imaging of breast: Secondary | ICD-10-CM

## 2021-04-21 NOTE — Progress Notes (Signed)
Normal mammogram. Follow up in 1 year.

## 2021-04-24 ENCOUNTER — Encounter: Payer: Medicare HMO | Admitting: Physician Assistant

## 2021-04-27 NOTE — Progress Notes (Signed)
error 

## 2021-04-28 ENCOUNTER — Telehealth: Payer: Self-pay | Admitting: Physician Assistant

## 2021-04-28 NOTE — Telephone Encounter (Signed)
Shirley James came in to ask for refills. She is in need of losartan 100mg  tab.,amLODipine 10mg , trazodone for bedtime. She is not sure if she is supposed to continue taking losartan-hydrochlorothiazide. If so, then that needs refilled as well. Pharmacy on file is correct.

## 2021-04-28 NOTE — Telephone Encounter (Signed)
Patient's husband made aware she has refills available at the pharmacy and should contact them directly.

## 2021-05-01 ENCOUNTER — Ambulatory Visit: Payer: Medicare HMO

## 2021-06-23 ENCOUNTER — Ambulatory Visit (INDEPENDENT_AMBULATORY_CARE_PROVIDER_SITE_OTHER): Payer: Medicare HMO | Admitting: Physician Assistant

## 2021-06-23 ENCOUNTER — Other Ambulatory Visit: Payer: Self-pay

## 2021-06-23 ENCOUNTER — Encounter: Payer: Self-pay | Admitting: Physician Assistant

## 2021-06-23 VITALS — BP 132/51 | HR 68 | Temp 98.1°F | Ht 62.0 in | Wt 128.0 lb

## 2021-06-23 DIAGNOSIS — G3184 Mild cognitive impairment, so stated: Secondary | ICD-10-CM

## 2021-06-23 DIAGNOSIS — I10 Essential (primary) hypertension: Secondary | ICD-10-CM

## 2021-06-23 DIAGNOSIS — R0989 Other specified symptoms and signs involving the circulatory and respiratory systems: Secondary | ICD-10-CM

## 2021-06-23 DIAGNOSIS — Z23 Encounter for immunization: Secondary | ICD-10-CM | POA: Diagnosis not present

## 2021-06-23 DIAGNOSIS — E538 Deficiency of other specified B group vitamins: Secondary | ICD-10-CM | POA: Diagnosis not present

## 2021-06-23 DIAGNOSIS — M8589 Other specified disorders of bone density and structure, multiple sites: Secondary | ICD-10-CM

## 2021-06-23 DIAGNOSIS — E7800 Pure hypercholesterolemia, unspecified: Secondary | ICD-10-CM

## 2021-06-23 DIAGNOSIS — R42 Dizziness and giddiness: Secondary | ICD-10-CM | POA: Diagnosis not present

## 2021-06-23 MED ORDER — LOSARTAN POTASSIUM-HCTZ 100-25 MG PO TABS: 1.0000 | ORAL_TABLET | Freq: Every day | ORAL | 3 refills | Status: DC

## 2021-06-23 MED ORDER — VITAMIN D 50 MCG (2000 UT) PO CAPS: 1.0000 | ORAL_CAPSULE | Freq: Every day | ORAL | 3 refills | Status: DC

## 2021-06-23 MED ORDER — ATORVASTATIN CALCIUM 40 MG PO TABS: 40.0000 mg | ORAL_TABLET | Freq: Every day | ORAL | 3 refills | Status: DC

## 2021-06-23 MED ORDER — DONEPEZIL HCL 5 MG PO TABS: 10.0000 mg | ORAL_TABLET | Freq: Every day | ORAL | 1 refills | Status: DC

## 2021-06-23 NOTE — Progress Notes (Signed)
Subjective:    Patient ID: Shirley James, female    DOB: 18-Jan-1941, 80 y.o.   MRN: [id number]  HPI Shirley James is a 80 yo female with HTN, low b12 and MCI who presents to the clinic for follow up.   Shirley James is doing well. Shirley James stopped a dementia study where Shirley James was receiving IVs monthly and since has been so much better with mood and memory. Shirley James is taking b12 and vitamin D. Shirley James continues on aricept. Shirley James continues to do a lot of mental crossword puzzles and quilts.   Shirley James does mention dizziness like the "room spining" off and on for the last 6 weeks. Shirley James feels like "something in the right side of neck is blocking something". No falls.   Last LDL was 09/2020 and was 114.    Not checking BP at home. No CP, palpitations, headaches or vision changes.  .. Active Ambulatory Problems    Diagnosis Date Noted   Essential hypertension, benign 08/03/2013   Insomnia 08/03/2013   PND (post-nasal drip) 11/07/2013   Heart murmur, systolic 05/13/2014   B12 deficiency 03/23/2015   Vitamin D deficiency 03/23/2015   Other fatigue 03/23/2015   LUQ pain 03/16/2016   Paresthesia of both hands 07/11/2017   Bilateral carpal tunnel syndrome 12/19/2017   Tinnitus of both ears 12/19/2017   Right carotid bruit 12/04/2018   Thyroid nodule 12/13/2018   Osteopenia 12/22/2018   Amnestic MCI (mild cognitive impairment with memory loss) 12/29/2020   Low serum vitamin B12 06/23/2021   Elevated LDL cholesterol level 06/23/2021   Resolved Ambulatory Problems    Diagnosis Date Noted   Epigastric pain 03/16/2016   Diarrhea 03/16/2016   H. pylori infection 03/17/2016   Past Medical History:  Diagnosis Date   Hypertension    .Marland Kitchen Family History  Problem Relation Age of Onset   Hypertension Mother    Diabetes Maternal Uncle    Diabetes Maternal Uncle      Review of Systems See HPI.     Objective:   Physical Exam Vitals reviewed.  Constitutional:      Appearance: Normal appearance.  HENT:     Head:  Normocephalic.     Nose: Nose normal.     Mouth/Throat:     Mouth: Mucous membranes are moist.  Eyes:     Conjunctiva/sclera: Conjunctivae normal.     Pupils: Pupils are equal, round, and reactive to light.  Neck:     Vascular: Carotid bruit present.     Comments: Right carotid bruit Cardiovascular:     Rate and Rhythm: Normal rate and regular rhythm.     Heart sounds: Murmur heard.  Pulmonary:     Effort: Pulmonary effort is normal.     Breath sounds: Normal breath sounds.  Neurological:     General: No focal deficit present.     Mental Status: Shirley James is alert.  Psychiatric:        Mood and Affect: Mood normal.          Assessment & Plan:  Marland KitchenMarland KitchenBrandon was seen today for vitamin d deficiency, vitamin b-12 deficiency and heart murmur.  Diagnoses and all orders for this visit:  Right carotid bruit -     atorvastatin (LIPITOR) 40 MG tablet; Take 1 tablet (40 mg total) by mouth daily. -     US Carotid Duplex Bilateral; Future  Essential hypertension, benign -     losartan-hydrochlorothiazide (HYZAAR) 100-25 MG tablet; Take 1 tablet by mouth daily.  Need for shingles  vaccine -     Varicella-zoster vaccine IM  Low serum vitamin B12  Amnestic MCI (mild cognitive impairment with memory loss) -     donepezil (ARICEPT) 5 MG tablet; Take 2 tablets (10 mg total) by mouth at bedtime.  Osteopenia of multiple sites -     Cholecalciferol (VITAMIN D) 50 MCG (2000 UT) CAPS; Take 1 capsule (2,000 Units total) by mouth daily.  Elevated LDL cholesterol level  Dizziness -     CT HEAD WO CONTRAST ( ); Future  Continue on vitamin D and b12 switch to sublingual. Recheck in 3 months.   BP to goal. Continue norvasc and Hyzaar.   Right carotid bruit heard on exam today. Ordered carotid u/s and started lipitor. Discussed side effects of lipitor.   Shirley James is also having some vertigo like symptoms. No URI symptoms. Concern for cerebellar stroke will get CT of head.

## 2021-06-23 NOTE — Patient Instructions (Addendum)
Order carotid ultrasound and start Lipitor at bedtime.  CT of head for dizziness.

## 2021-07-07 ENCOUNTER — Other Ambulatory Visit: Payer: Self-pay

## 2021-07-07 ENCOUNTER — Ambulatory Visit (INDEPENDENT_AMBULATORY_CARE_PROVIDER_SITE_OTHER): Payer: Medicare HMO

## 2021-07-07 DIAGNOSIS — I6523 Occlusion and stenosis of bilateral carotid arteries: Secondary | ICD-10-CM | POA: Diagnosis not present

## 2021-07-07 DIAGNOSIS — R42 Dizziness and giddiness: Secondary | ICD-10-CM

## 2021-07-07 DIAGNOSIS — R0989 Other specified symptoms and signs involving the circulatory and respiratory systems: Secondary | ICD-10-CM

## 2021-07-08 ENCOUNTER — Other Ambulatory Visit: Payer: Self-pay | Admitting: Physician Assistant

## 2021-07-08 ENCOUNTER — Encounter: Payer: Self-pay | Admitting: Physician Assistant

## 2021-07-08 DIAGNOSIS — I6523 Occlusion and stenosis of bilateral carotid arteries: Secondary | ICD-10-CM | POA: Insufficient documentation

## 2021-07-08 NOTE — Progress Notes (Signed)
No acute changes in the brain.   Minimal chronic small vessel ischemic changes. Continue to control blood pressure and take statin and I would start ASA '81mg'$  a day if not taking.   Mild cerebral atrophy. Consistent with your known mild cognitive impairment.

## 2021-07-08 NOTE — Progress Notes (Signed)
The plaque accumulation in carotids is still minor and under 50 percent. Right is worse than left. No intervention is needed. Make sure stay on lipitor and start ASA '81mg'$  daily.

## 2021-07-30 ENCOUNTER — Other Ambulatory Visit: Payer: Self-pay | Admitting: Physician Assistant

## 2021-07-30 ENCOUNTER — Telehealth: Payer: Self-pay | Admitting: Lab

## 2021-07-30 NOTE — Chronic Care Management (AMB) (Signed)
Chronic Care Management   Note  07/30/2021 Name: SOCORRA BALTES MRN: [id number] DOB: 12/26/1940  ATIRA FRAZZINI is a 80 y.o. year old female who is a primary care patient of Jomarie Longs, [place]. I reached out to [name] by phone today in response to a referral sent by Ms. Vinetta Bergamo Dawn's PCP, Caleen Essex, Jade L, PA-C.   Ms. Wittstruck was given information about Chronic Care Management services today including:  CCM service includes personalized support from designated clinical staff supervised by her physician, including individualized plan of care and coordination with other care providers 24/7 contact phone numbers for assistance for urgent and routine care needs. Service will only be billed when office clinical staff spend 20 minutes or more in a month to coordinate care. Only one practitioner may furnish and bill the service in a calendar month. The patient may stop CCM services at any time (effective at the end of the month) by phone call to the office staff.   Patient agreed to services and verbal consent obtained.   Follow up plan:   Carilyn Goodpasture  Upstream Scheduler

## 2021-07-31 ENCOUNTER — Telehealth: Payer: Self-pay | Admitting: Neurology

## 2021-07-31 ENCOUNTER — Other Ambulatory Visit: Payer: Self-pay | Admitting: Physician Assistant

## 2021-07-31 NOTE — Telephone Encounter (Signed)
Received a call from patient's pharmacy after we refused HCTZ because patient should be on combo pill (Losartan HCTZ) according to last office note. Pharmacy states she is taking them separate. Okay to send that way?

## 2021-08-03 MED ORDER — LOSARTAN POTASSIUM 100 MG PO TABS: 100.0000 mg | ORAL_TABLET | Freq: Every day | ORAL | 1 refills | Status: DC

## 2021-08-03 MED ORDER — HYDROCHLOROTHIAZIDE 25 MG PO TABS: 25.0000 mg | ORAL_TABLET | Freq: Every day | ORAL | 1 refills | Status: DC

## 2021-08-03 NOTE — Telephone Encounter (Signed)
Prescriptions sent

## 2021-09-04 ENCOUNTER — Telehealth: Payer: Self-pay | Admitting: Pharmacist

## 2021-09-04 NOTE — Chronic Care Management (AMB) (Signed)
Chronic Care Management Pharmacy Assistant   Name: Shirley James  MRN: [id number] DOB: 1941-06-03  Shirley James is an 80 y.o. year old female who presents for his initial CCM visit with the clinical pharmacist.  Recent office visits:  06/23/21-Jade L. Caleen Essex, PA-C (PCP) General follow up visit. Order carotid ultrasound and start Lipitor at bedtime.  CT of head for dizziness. Follow up in 3 months. 04/01/21-Jade L. Caleen Essex, PA-C (PCP) Seen for pernicious anemia. B12 injection given. Follow up in 4 weeks.  Recent consult visits:  07/07/21-Vedwattie Moses (Certifieed registered nurse anesthetist) Notes not available. 07/07/21-Kyle Manfred Shirts (Diagnostic radiology)  07/07/21-Michael Shick (Diagnostic radiology)  03/18/21-Drew Earlene Plater (Diagnostic Radiology)  03/18/21-William Margarita Grizzle (Diagnostic radiology)  03/18/21-Vedwattie Patrcia Dolly (Certified registered nurse anesthetist)  Hospital visits:  None in previous 6 months  Medications: Outpatient Encounter Medications as of 09/04/2021  Medication Sig   amLODipine (NORVASC) 10 MG tablet Take 1 tablet (10 mg total) by mouth daily.   atorvastatin (LIPITOR) 40 MG tablet Take 1 tablet (40 mg total) by mouth daily.   Cholecalciferol (VITAMIN D) 50 MCG (2000 UT) CAPS Take 1 capsule (2,000 Units total) by mouth daily.   donepezil (ARICEPT) 5 MG tablet Take 2 tablets (10 mg total) by mouth at bedtime.   hydrochlorothiazide (HYDRODIURIL) 25 MG tablet Take 1 tablet (25 mg total) by mouth daily.   losartan (COZAAR) 100 MG tablet Take 1 tablet (100 mg total) by mouth daily.   losartan-hydrochlorothiazide (HYZAAR) 100-25 MG tablet Take 1 tablet by mouth daily.   No facility-administered encounter medications on file as of 09/04/2021.   AmLODipine (NORVASC) 10 MG tablet Last filled:07/28/21 90 DS Atorvastatin (LIPITOR) 40 MG tablet  Last filled:06/23/21 90 DS Cholecalciferol (VITAMIN D) 50 MCG (2000 UT) CAPS  Last filled:None noted 05/25/21 90  DS Hydrochlorothiazide (HYDRODIURIL) 25 MG tablet  Last filled:08/03/21 90 DS Losartan (COZAAR) 100 MG tablet  Last filled:07/23/21 90 DS Losartan-hydrochlorothiazide (HYZAAR) 100-25 MG tablet  Last filled:07/15/20 90 DS   Care Gaps: COVID-19 Vaccine:Overdue since 12/27/2020 INFLUENZA VACCINE:Overdue since 06/22/2021  Star Rating Drugs: Atorvastatin (LIPITOR) 40 MG tablet  Last filled:06/23/21 90 DS Losartan (COZAAR) 100 MG tablet  Last filled:07/23/21 90 DS Losartan-hydrochlorothiazide (HYZAAR) 100-25 MG tablet  Last filled:07/15/20 90 DS  Myriam Carolin Coy, RMA Health Concierge

## 2021-09-16 ENCOUNTER — Ambulatory Visit: Payer: Medicare HMO

## 2021-09-23 ENCOUNTER — Encounter: Payer: Self-pay | Admitting: Physician Assistant

## 2021-09-23 ENCOUNTER — Ambulatory Visit (INDEPENDENT_AMBULATORY_CARE_PROVIDER_SITE_OTHER): Payer: Medicare HMO | Admitting: Physician Assistant

## 2021-09-23 VITALS — BP 135/56 | HR 89 | Ht 62.0 in | Wt 135.0 lb

## 2021-09-23 DIAGNOSIS — R0989 Other specified symptoms and signs involving the circulatory and respiratory systems: Secondary | ICD-10-CM | POA: Diagnosis not present

## 2021-09-23 DIAGNOSIS — E538 Deficiency of other specified B group vitamins: Secondary | ICD-10-CM | POA: Diagnosis not present

## 2021-09-23 DIAGNOSIS — G3184 Mild cognitive impairment, so stated: Secondary | ICD-10-CM | POA: Diagnosis not present

## 2021-09-23 DIAGNOSIS — I10 Essential (primary) hypertension: Secondary | ICD-10-CM

## 2021-09-23 DIAGNOSIS — Z23 Encounter for immunization: Secondary | ICD-10-CM

## 2021-09-23 LAB — LIPID PANEL W/REFLEX DIRECT LDL
Cholesterol: 154 mg/dL (ref <200)
HDL: 90 mg/dL (ref >=50)
LDL Cholesterol (Calc): 52 mg/dL (calc)
Non-HDL Cholesterol (Calc): 64 mg/dL (calc) (ref <130)
Total CHOL/HDL Ratio: 1.7 (calc) (ref <5.0)
Triglycerides: 41 mg/dL (ref <150)

## 2021-09-23 LAB — COMPLETE METABOLIC PANEL WITH GFR
AG Ratio: 1.5 (calc) (ref 1.0–2.5)
ALT: 18 U/L (ref 6–29)
AST: 20 U/L (ref 10–35)
Albumin: 4.4 g/dL (ref 3.6–5.1)
Alkaline phosphatase (APISO): 61 U/L (ref 37–153)
BUN: 12 mg/dL (ref 7–25)
CO2: 32 mmol/L (ref 20–32)
Calcium: 9.4 mg/dL (ref 8.6–10.4)
Chloride: 98 mmol/L (ref 98–110)
Creat: 0.8 mg/dL (ref 0.60–1.00)
Globulin: 3 g/dL (calc) (ref 1.9–3.7)
Glucose, Bld: 95 mg/dL (ref 65–99)
Potassium: 3.7 mmol/L (ref 3.5–5.3)
Sodium: 138 mmol/L (ref 135–146)
Total Bilirubin: 0.5 mg/dL (ref 0.2–1.2)
Total Protein: 7.4 g/dL (ref 6.1–8.1)
eGFR: 75 mL/min/{1.73_m2} (ref >=60)

## 2021-09-23 MED ORDER — ATORVASTATIN CALCIUM 40 MG PO TABS: 40.0000 mg | ORAL_TABLET | Freq: Every day | ORAL | 3 refills | Status: DC

## 2021-09-23 MED ORDER — DONEPEZIL HCL 5 MG PO TABS: 5.0000 mg | ORAL_TABLET | Freq: Every day | ORAL | 1 refills | Status: DC

## 2021-09-23 MED ORDER — CYANOCOBALAMIN 1000 MCG/ML IJ SOLN
1000.0000 ug | Freq: Once | INTRAMUSCULAR | Status: AC
  Administered 2021-09-23: 1000 ug via INTRAMUSCULAR

## 2021-09-23 NOTE — Patient Instructions (Addendum)
B12 shot monthly for 3 months then recheck B12.  Start ASA 81mg  daily.  Stay on lipitor daily.  Start aricept 5mg  at bedtime for memory.  Continue with hyzaar and norvasc for blood pressure.

## 2021-09-23 NOTE — Progress Notes (Signed)
Subjective:    Patient ID: Shirley James, female    DOB: Apr 20, 1941, 80 y.o.   MRN: [id number]  HPI Pt is a 80 yo female with HTN, HLD, right carotid bruit, mild cognitive impairment who presents to the clinic for follow up.   She is doing well. She stopped the memory research study. She is doing pretty good but still forgetful. Not on aricept. Not sure why not.  Pt denies any CP, palpitations, headaches, or vision changes.   She does need refills on medication.   .. 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 pylori infection 03/17/2016   Past Medical History:  Diagnosis Date   Hypertension        Review of Systems  All other systems reviewed and are negative.     Objective:   Physical Exam Vitals reviewed.  Constitutional:      Appearance: Normal appearance.  HENT:     Head: Normocephalic.     Right Ear: Tympanic membrane normal.     Left Ear: Tympanic membrane normal.  Neck:     Comments: Right carotid bruit Cardiovascular:     Rate and Rhythm: Normal rate and regular rhythm.     Pulses: Normal pulses.     Heart sounds: Normal heart sounds.  Pulmonary:     Effort: Pulmonary effort is  normal.     Breath sounds: Normal breath sounds.  Lymphadenopathy:     Cervical: No cervical adenopathy.  Neurological:     General: No focal deficit present.     Mental Status: She is alert and oriented to person, place, and time.  Psychiatric:        Mood and Affect: Mood normal.           Assessment & Plan:  Marland KitchenMarland KitchenKristopher was seen today for follow-up.  Diagnoses and all orders for this visit:  Essential hypertension, benign -     COMPLETE METABOLIC PANEL WITH GFR  Flu vaccine need -     Flu Vaccine QUAD High Dose(Fluad)  Low serum vitamin B12 -     cyanocobalamin ((VITAMIN B-12)) injection 1,000 mcg  Amnestic MCI (mild cognitive impairment with memory loss) -     donepezil (ARICEPT) 5 MG tablet; Take 1 tablet (5 mg total) by mouth at bedtime.  Right carotid bruit -     Lipid Panel w/reflex Direct LDL -     atorvastatin (LIPITOR) 40 MG tablet; Take 1 tablet (40 mg total) by mouth daily.  Pt reported not taking aricept. Discussed why she was on it and encouraged her to start taking it.   Discussed statin and importance. Pt agreed to start taking lipitor again. Lipid and cmp ordered.   Reminded  to start ASA 81mg  daily.   Low b12. shot given today. Come in monthly for shot and recheck b12 level in 3 months.   Will see if restarting aricept and b12 shots make any difference in memory.   BP looks good. Medications refilled. Cmp ordered.

## 2021-09-24 NOTE — Progress Notes (Signed)
Cholesterol looks fabulous.  Kidney, liver, glucose look good.

## 2021-09-30 ENCOUNTER — Other Ambulatory Visit: Payer: Self-pay

## 2021-09-30 ENCOUNTER — Ambulatory Visit (INDEPENDENT_AMBULATORY_CARE_PROVIDER_SITE_OTHER): Payer: Medicare HMO | Admitting: Pharmacist

## 2021-09-30 VITALS — BP 135/73 | HR 89

## 2021-09-30 DIAGNOSIS — I10 Essential (primary) hypertension: Secondary | ICD-10-CM

## 2021-09-30 DIAGNOSIS — E7800 Pure hypercholesterolemia, unspecified: Secondary | ICD-10-CM

## 2021-09-30 NOTE — Progress Notes (Signed)
Chronic Care Management Pharmacy Note  09/30/2021 Name:  Shirley James MRN:  [id number] DOB:  10-Apr-1941  Summary: addressed HTN, HLD. Patient doing well on current medications.  Recommendations/Changes made from today's visit: none   Plan: f/u with pharmacist in 6-8 months  Subjective: Shirley James is an 80 y.o. year old female who is a primary patient of Jomarie Longs, [place].  The CCM team was consulted for assistance with disease management and care coordination needs.    Engaged with patient face to face for initial visit in response to provider referral for pharmacy case management and/or care coordination services.   Consent to Services:  The patient was given information about Chronic Care Management services, agreed to services, and gave verbal consent prior to initiation of services.  Please see initial visit note for detailed documentation.   Patient Care Team: Nolene Ebbs as PCP - General (Family Medicine) Gabriel Carina, [facility] as Pharmacist (Pharmacist)  Recent office visits:  06/23/21-Jade L. Caleen Essex, PA-C (PCP) General follow up visit. Order carotid ultrasound and start Lipitor at bedtime.  CT of head for dizziness. Follow up in 3 months. 04/01/21-Jade L. Caleen Essex, PA-C (PCP) Seen for pernicious anemia. B12 injection given. Follow up in 4 weeks.   Recent consult visits:  07/07/21-Vedwattie Moses (Certifieed registered nurse anesthetist) Notes not available. 07/07/21-Kyle Manfred Shirts (Diagnostic radiology)  07/07/21-Michael Shick (Diagnostic radiology)  03/18/21-Drew Earlene Plater (Diagnostic Radiology)  03/18/21-William Margarita Grizzle (Diagnostic radiology)  03/18/21-Vedwattie Patrcia Dolly (Certified registered nurse anesthetist)   Hospital visits:  None in previous 6 months    Objective:  Lab Results  Component Value Date   CREATININE 0.80 09/23/2021   CREATININE 0.96 (H) 12/04/2018   CREATININE 0.77 12/23/2017      Component Value Date/Time   CHOL 154  09/23/2021 0000   TRIG 41 09/23/2021 0000   HDL 90 09/23/2021 0000   CHOLHDL 1.7 09/23/2021 0000   VLDL 14 06/03/2016 0905   LDLCALC 52 09/23/2021 0000    Hepatic Function Latest Ref Rng & Units 09/23/2021 12/04/2018 12/23/2017  Total Protein 6.1 - 8.1 g/dL 7.4 7.3 6.9  Albumin 3.6 - 5.1 g/dL - - -  AST 10 - 35 U/L 20 20 14   ALT 6 - 29 U/L 18 15 12   Alk Phosphatase 33 - 130 U/L - - -  Total Bilirubin 0.2 - 1.2 mg/dL 0.5 0.5 0.5    Lab Results  Component Value Date/Time   TSH 1.58 12/23/2017 09:30 AM   TSH 1.106 03/21/2015 10:22 AM    CBC Latest Ref Rng & Units 12/23/2017 06/03/2016  WBC 3.8 - 10.8 Thousand/uL 3.6(L) 3.9  Hemoglobin 11.7 - 15.5 g/dL 64.4 03.4  Hematocrit 74.2 - 45.0 % 37.8 38.8  Platelets 140 - 400 Thousand/uL 335 321    Lab Results  Component Value Date/Time   VD25OH 12 (L) 03/21/2015 10:22 AM    Social History   Tobacco Use  Smoking Status Former  Smokeless Tobacco Never   BP Readings from Last 3 Encounters:  09/30/21 135/73  09/23/21 (!) 135/56  06/23/21 (!) 132/51   Pulse Readings from Last 3 Encounters:  09/30/21 89  09/23/21 89  06/23/21 68   Wt Readings from Last 3 Encounters:  09/23/21 135 lb (61.2 kg)  06/23/21 128 lb (58.1 kg)  04/24/21 139 lb (63 kg)    Assessment: Review of patient past medical history, allergies, medications, health status, including review of consultants reports, laboratory and other test data, was performed as  part of comprehensive evaluation and provision of chronic care management services.   SDOH:  (Social Determinants of Health) assessments and interventions performed:    CCM Care Plan  Allergies  Allergen Reactions   Codeine     Medications Reviewed Today     Reviewed by Gabriel Carina, [facility] (Pharmacist) on 09/30/21 at 0902  Med List Status: <None>   Medication Order Taking? Sig Documenting Provider Last Dose Status Informant  amLODipine (NORVASC) 10 MG tablet [id number] Yes Take 1 tablet (10 mg  total) by mouth daily. Jomarie Longs, PA-C Taking Active   atorvastatin (LIPITOR) 40 MG tablet [id number] Yes Take 1 tablet (40 mg total) by mouth daily. Jomarie Longs, PA-C Taking Active   Cholecalciferol (VITAMIN D) 50 MCG (2000 UT) CAPS [id number] Yes Take 1 capsule (2,000 Units total) by mouth daily. Jomarie Longs, PA-C Taking Active   donepezil (ARICEPT) 5 MG tablet [id number] Yes Take 1 tablet (5 mg total) by mouth at bedtime. Jomarie Longs, PA-C Taking Active   losartan-hydrochlorothiazide (HYZAAR) 100-25 MG tablet [id number] Yes Take 1 tablet by mouth daily. Jomarie Longs, PA-C Taking Active             Patient Active Problem List   Diagnosis Date Noted   Carotid atherosclerosis, bilateral 07/08/2021   Low serum vitamin B12 06/23/2021   Elevated LDL cholesterol level 06/23/2021   Amnestic MCI (mild cognitive impairment with memory loss) 12/29/2020   Osteopenia 12/22/2018   Thyroid nodule 12/13/2018   Right carotid bruit 12/04/2018   Bilateral carpal tunnel syndrome 12/19/2017   Tinnitus of both ears 12/19/2017   Paresthesia of both hands 07/11/2017   LUQ pain 03/16/2016   B12 deficiency 03/23/2015   Vitamin D deficiency 03/23/2015   Other fatigue 03/23/2015   Heart murmur, systolic 05/13/2014   PND (post-nasal drip) 11/07/2013   Essential hypertension, benign 08/03/2013   Insomnia 08/03/2013    Immunization History  Administered Date(s) Administered   Fluad Quad(high Dose 65+) 07/12/2019, 09/23/2021   Influenza, High Dose Seasonal PF 07/11/2017, 08/29/2018, 07/12/2019, 10/20/2020   Influenza,inj,Quad PF,6+ Mos 08/01/2013, 12/02/2015   Influenza-Unspecified 09/18/2014, 08/25/2016, 08/29/2018   PFIZER(Purple Top)SARS-COV-2 Vaccination 12/08/2019, 01/08/2020, 08/26/2020, 08/23/2021   Pneumococcal Conjugate-13 05/13/2014   Pneumococcal Polysaccharide-23 05/31/2016   Tdap 11/05/2014   Zoster Recombinat (Shingrix) 12/04/2018, 06/23/2021    Conditions to be  addressed/monitored: HTN and HLD  Care Plan : Medication Management  Updates made by Gabriel Carina, RPH since 09/30/2021 12:00 AM     Problem: HTN, HLD      Long-Range Goal: Disease Progression Prevention   Start Date: 09/30/2021  This Visit's Progress: On track  Priority: High  Note:   Current Barriers:  None at present   Pharmacist Clinical Goal(s):  Over the next 180 days, patient will maintain control of chronic conditions as evidenced by medication fill history, lab values, and vital signs  through collaboration with PharmD and provider.   Interventions: 1:1 collaboration with Jomarie Longs, PA-C regarding development and update of comprehensive plan of care as evidenced by provider attestation and co-signature Inter-disciplinary care team collaboration (see longitudinal plan of care) Comprehensive medication review performed; medication list updated in electronic medical record  Hypertension:  Controlled; current treatment:amlodipine 10mg  daily, losartan-hctz 100-25mg  daily;   Current home readings: not currently checking   Denies hypotensive/hypertensive symptoms  Counseled on proper technique for accurately checking BP at home Recommended continue current regimen, use home BP cuff occasionally and document numbers for discussion  at future visits  Hyperlipidemia:  Controlled; current treatment:atorvastatin 40mg  daily; LDL 52  Recommended continue current regimen  Patient Goals/Self-Care Activities Over the next 180 days, patient will:  take medications as prescribed and check blood pressure 1x per week, document, and provide at future appointments  Follow Up Plan: Telephone follow up appointment with care management team member scheduled for:  6-8 months      Medication Assistance: None required.  Patient affirms current coverage meets needs.  Patient's preferred pharmacy is:  [id number] [employer] - [place], [place] - [address] [place] [address] Phone: [phone] Fax: [phone]  Uses pill box? No - no issues Pt endorses 100% compliance  Follow Up:  Patient agrees to Care Plan and Follow-up.  Plan: Telephone follow up appointment with care management team member scheduled for:  6-8 months  Lynnda Shields, PharmD Clinical Pharmacist [facility] [phone]

## 2021-09-30 NOTE — Patient Instructions (Signed)
Visit Information   PATIENT GOALS/PLAN OF CARE:  Care Plan : Medication Management  Updates made by Shirley James, Shirley James since 09/30/2021 12:00 AM     Problem: HTN, HLD      Long-Range Goal: Disease Progression Prevention   Start Date: 09/30/2021  This Visit's Progress: On track  Priority: High  Note:   Current Barriers:  None at present   Pharmacist Clinical Goal(s):  Over the next 180 days, patient will maintain control of chronic conditions as evidenced by medication fill history, lab values, and vital signs  through collaboration with PharmD and provider.   Interventions: 1:1 collaboration with Shirley Stade, PA-C regarding development and update of comprehensive plan of care as evidenced by provider attestation and co-signature Inter-disciplinary care team collaboration (see longitudinal plan of care) Comprehensive medication review performed; medication list updated in electronic medical record  Hypertension:  Controlled; current treatment:amlodipine 4m daily, losartan-hctz 100-277mdaily;   Current home readings: not currently checking   Denies hypotensive/hypertensive symptoms  Counseled on proper technique for accurately checking BP at home Recommended continue current regimen, use home BP cuff occasionally and document numbers for discussion at future visits  Hyperlipidemia:  Controlled; current treatment:atorvastatin 40109maily; LDL 52  Recommended continue current regimen  Patient Goals/Self-Care Activities Over the next 180 days, patient will:  take medications as prescribed and check blood pressure 1x per week, document, and provide at future appointments  Follow Up Plan: Telephone follow up appointment with care management team member scheduled for:  6-8 months      Consent to CCM Services: Shirley James given information about Chronic Care Management services including:  CCM service includes personalized support from designated clinical staff  supervised by her physician, including individualized plan of care and coordination with other care providers 24/7 contact phone numbers for assistance for urgent and routine care needs. Service will only be billed when office clinical staff spend 20 minutes or more in a month to coordinate care. Only one practitioner may furnish and bill the service in a calendar month. The patient may stop CCM services at any time (effective at the end of the month) by phone call to the office staff. The patient will be responsible for cost sharing (co-pay) of up to 20% of the service fee (after annual deductible is met).  Patient agreed to services and verbal consent obtained.   The patient verbalized understanding of instructions, educational materials, and care plan provided today and agreed to receive a mailed copy of patient instructions, educational materials, and care plan.   Telephone follow up appointment with care management team member scheduled for: 6-8 months  Shirley James

## 2021-10-21 DIAGNOSIS — E7800 Pure hypercholesterolemia, unspecified: Secondary | ICD-10-CM | POA: Diagnosis not present

## 2021-10-21 DIAGNOSIS — I10 Essential (primary) hypertension: Secondary | ICD-10-CM | POA: Diagnosis not present

## 2021-12-24 ENCOUNTER — Other Ambulatory Visit: Payer: Self-pay

## 2021-12-24 ENCOUNTER — Ambulatory Visit (INDEPENDENT_AMBULATORY_CARE_PROVIDER_SITE_OTHER): Payer: Medicare HMO | Admitting: Family Medicine

## 2021-12-24 VITALS — BP 143/54 | HR 84

## 2021-12-24 DIAGNOSIS — E538 Deficiency of other specified B group vitamins: Secondary | ICD-10-CM

## 2021-12-24 MED ORDER — CYANOCOBALAMIN 1000 MCG/ML IJ SOLN
1000.0000 ug | Freq: Once | INTRAMUSCULAR | Status: AC
  Administered 2021-12-24: 1000 ug via INTRAMUSCULAR

## 2021-12-24 NOTE — Progress Notes (Signed)
Agree with documentation as above.  Labs to be done within a couple of days before her next injection or can even be the day of his long visits before the injection her last B12 on file was in March 2022.  Beatrice Lecher, MD

## 2021-12-24 NOTE — Progress Notes (Signed)
Patient is here for a vitamin B12 injection. Denies gastrointestinal problems or dizziness. B12 injection to right deltoid with no apparent complications. Patient advised to schedule next injection in 30 days.   

## 2022-01-06 ENCOUNTER — Other Ambulatory Visit: Payer: Self-pay | Admitting: Physician Assistant

## 2022-01-06 DIAGNOSIS — I10 Essential (primary) hypertension: Secondary | ICD-10-CM

## 2022-01-07 ENCOUNTER — Other Ambulatory Visit: Payer: Self-pay | Admitting: Physician Assistant

## 2022-01-07 DIAGNOSIS — I10 Essential (primary) hypertension: Secondary | ICD-10-CM

## 2022-01-21 ENCOUNTER — Other Ambulatory Visit: Payer: Self-pay

## 2022-01-21 ENCOUNTER — Ambulatory Visit (INDEPENDENT_AMBULATORY_CARE_PROVIDER_SITE_OTHER): Payer: Medicare HMO | Admitting: Family Medicine

## 2022-01-21 VITALS — BP 147/58 | HR 80

## 2022-01-21 DIAGNOSIS — E538 Deficiency of other specified B group vitamins: Secondary | ICD-10-CM | POA: Diagnosis not present

## 2022-01-21 MED ORDER — CYANOCOBALAMIN 1000 MCG/ML IJ SOLN
1000.0000 ug | Freq: Once | INTRAMUSCULAR | Status: AC
  Administered 2022-01-21: 1000 ug via INTRAMUSCULAR

## 2022-01-21 NOTE — Progress Notes (Signed)
Patient is here for a vitamin B12 injection. Denies gastrointestinal problems or dizziness. Patient due for labs and had these drawn before her injection. B12 injection to left deltoid with no apparent complications. Patient advised to schedule next injection in 30 days. Will call with lab results.  ? ?

## 2022-01-21 NOTE — Progress Notes (Signed)
Agree with documentation as above.   Elajah Kunsman, MD  

## 2022-01-22 LAB — CBC
HCT: 38.1 % (ref 35.0–45.0)
Hemoglobin: 12.9 g/dL (ref 11.7–15.5)
MCH: 31.9 pg (ref 27.0–33.0)
MCHC: 33.9 g/dL (ref 32.0–36.0)
MCV: 94.3 fL (ref 80.0–100.0)
MPV: 8.9 fL (ref 7.5–12.5)
Platelets: 291 10*3/uL (ref 140–400)
RBC: 4.04 10*6/uL (ref 3.80–5.10)
RDW: 12.9 % (ref 11.0–15.0)
WBC: 3.4 10*3/uL — ABNORMAL LOW (ref 3.8–10.8)

## 2022-01-22 LAB — VITAMIN B12: Vitamin B-12: 470 pg/mL (ref 200–1100)

## 2022-01-22 NOTE — Progress Notes (Signed)
Call patient: B12 looks good.  Continue with I believe monthly injections is what she is doing so please continue with that routine.  Plan to recheck B12 again in about 6 to 9 months.

## 2022-02-22 ENCOUNTER — Ambulatory Visit: Payer: Medicare HMO

## 2022-04-08 ENCOUNTER — Other Ambulatory Visit: Payer: Self-pay | Admitting: Physician Assistant

## 2022-04-08 DIAGNOSIS — G3184 Mild cognitive impairment, so stated: Secondary | ICD-10-CM

## 2022-04-30 ENCOUNTER — Telehealth: Payer: Self-pay | Admitting: *Deleted

## 2022-04-30 NOTE — Chronic Care Management (AMB) (Signed)
Chronic Care Management Note  04/30/2022 Name: Shirley James MRN: [id number] DOB: [birth year]  Shirley James is a 81 y.o. year old female who is a primary care patient of Nolene Ebbs and is actively engaged with the care management team. I reached out to [name] by phone today to assist with re-scheduling a follow up visit with the Pharmacist  Follow up plan: Unsuccessful telephone outreach attempt made. A HIPAA compliant phone message was left for the patient providing contact information and requesting a return call.   Burman Nieves, CCMA Care Guide, Embedded Care Coordination [facility]  Care Management  Direct Dial: [phone]

## 2022-05-06 ENCOUNTER — Telehealth: Payer: Medicare HMO

## 2022-05-12 NOTE — Chronic Care Management (AMB) (Signed)
Chronic Care Management Note  05/12/2022 Name: MAZZY WHITEFOOT MRN: [id number] DOB: December 30, 1940  Shirley James is a 81 y.o. year old female who is a primary care patient of Nolene Ebbs and is actively engaged with the care management team. I reached out to [name] by phone today to assist with re-scheduling a follow up visit with the Pharmacist  Follow up plan: 2nd Unsuccessful telephone outreach attempt made. A HIPAA compliant phone message was left for the patient providing contact information and requesting a return call.   Burman Nieves, CCMA Care Guide, Embedded Care Coordination [facility]  Care Management  Direct Dial: [phone]

## 2022-05-20 NOTE — Chronic Care Management (AMB) (Signed)
Chronic Care Management Note  05/20/2022 Name: Shirley James MRN: [id number] DOB: November 01, 1941  Shirley James is a 81 y.o. year old female who is a primary care patient of Nolene Ebbs and is actively engaged with the care management team. I reached out to [name] by phone today to assist with re-scheduling a follow up visit with the Pharmacist  Follow up plan: We have been unable to make contact with the patient for follow up. The care management team is available to follow up with the patient after provider conversation with the patient regarding recommendation for care management engagement and subsequent re-referral to the care management team.   Burman Nieves, CCMA Care Guide, Embedded Care Coordination [facility]  Care Management  Direct Dial: [phone]

## 2022-08-06 ENCOUNTER — Encounter: Payer: Self-pay | Admitting: Physician Assistant

## 2022-08-06 ENCOUNTER — Ambulatory Visit (INDEPENDENT_AMBULATORY_CARE_PROVIDER_SITE_OTHER): Payer: Medicare HMO | Admitting: Physician Assistant

## 2022-08-06 VITALS — BP 157/57 | HR 92 | Ht 62.0 in | Wt 136.0 lb

## 2022-08-06 DIAGNOSIS — L299 Pruritus, unspecified: Secondary | ICD-10-CM

## 2022-08-06 DIAGNOSIS — L237 Allergic contact dermatitis due to plants, except food: Secondary | ICD-10-CM

## 2022-08-06 MED ORDER — TRIAMCINOLONE ACETONIDE 0.1 % EX CREA: 1.0000 | TOPICAL_CREAM | Freq: Two times a day (BID) | CUTANEOUS | 0 refills | Status: DC

## 2022-08-06 MED ORDER — METHYLPREDNISOLONE SODIUM SUCC 125 MG IJ SOLR
125.0000 mg | Freq: Once | INTRAMUSCULAR | Status: AC
  Administered 2022-08-06: 125 mg via INTRAMUSCULAR

## 2022-08-06 NOTE — Patient Instructions (Signed)
Poison Ivy Dermatitis Poison ivy dermatitis is inflammation of the skin that is caused by chemicals in the leaves of the poison ivy plant. The skin reaction often involves redness, swelling, blisters, and extreme itching. What are the causes? This condition is caused by a chemical (urushiol) found in the sap of the poison ivy plant. This chemical is sticky and can be easily spread to people, animals, and objects. You can get poison ivy dermatitis by: Having direct contact with a poison ivy plant. Touching animals, other people, or objects that have come in contact with poison ivy and have the chemical on them. What increases the risk? This condition is more likely to develop in people who: Are outdoors often in wooded or [place] areas. Go outdoors without wearing protective clothing, such as closed shoes, long pants, and a long-sleeved shirt. What are the signs or symptoms? Symptoms of this condition include: Redness of the skin. Extreme itching. A rash that often includes bumps and blisters. The rash usually appears 48 hours after exposure, if you have been exposed before. If this is the first time you have been exposed, the rash may not appear until a week after exposure. Swelling. This may occur if the reaction is more severe. Symptoms usually last for 1-2 weeks. However, the first time you develop this condition, symptoms may last 3-4 weeks. How is this diagnosed? This condition may be diagnosed based on your symptoms and a physical exam. Your health care provider may also ask you about any recent outdoor activity. How is this treated? Treatment for this condition will vary depending on how severe it is. Treatment may include: Hydrocortisone cream or calamine lotion to relieve itching. Oatmeal baths to soothe the skin. Medicines, such as over-the-counter antihistamine tablets. Oral steroid medicine, for more severe reactions. Follow these instructions at home: Medicines Take or apply  over-the-counter and prescription medicines only as told by your health care provider. Use hydrocortisone cream or calamine lotion as needed to soothe the skin and relieve itching. General instructions Do not scratch or rub your skin. Apply a cold, wet cloth (cold compress) to the affected areas or take baths in cool water. This will help with itching. Avoid hot baths and showers. Take oatmeal baths as needed. Use colloidal oatmeal. You can get this at your local pharmacy or grocery store. Follow the instructions on the packaging. While you have the rash, wash clothes right after you wear them. Keep all follow-up visits as told by your health care provider. This is important. How is this prevented?  Learn to identify the poison ivy plant and avoid contact with the plant. This plant can be recognized by the number of leaves. Generally, poison ivy has three leaves with flowering branches on a single stem. The leaves are typically glossy, and they have jagged edges that come to a point at the front. If you have been exposed to poison ivy, thoroughly wash with soap and water right away. You have about 30 minutes to remove the plant resin before it will cause the rash. Be sure to wash under your fingernails, because any plant resin there will continue to spread the rash. When hiking or camping, wear clothes that will help you to avoid exposure on the skin. This includes long pants, a long-sleeved shirt, tall socks, and hiking boots. You can also apply preventive lotion to your skin to help limit exposure. If you suspect that your clothes or outdoor gear came in contact with poison ivy, rinse them off outside  with a garden hose before you bring them inside your house. When doing yard work or gardening, wear gloves, long sleeves, long pants, and boots. Wash your garden tools and gloves if they come in contact with poison ivy. If you suspect that your pet has come into contact with poison ivy, wash him or her  with pet shampoo and water. Make sure to wear gloves while washing your pet. Contact a health care provider if you have: Open sores in the rash area. More redness, swelling, or pain in the affected area. Redness that spreads beyond the rash area. Fluid, blood, or pus coming from the affected area. A fever. A rash over a large area of your body. A rash on your eyes, mouth, or genitals. A rash that does not improve after a few weeks. Get help right away if: Your face swells or your eyes swell shut. You have trouble breathing. You have trouble swallowing. These symptoms may represent a serious problem that is an emergency. Do not wait to see if the symptoms will go away. Get medical help right away. Call your local emergency services (911 in the [place] Do not drive yourself to the hospital. Summary Poison ivy dermatitis is inflammation of the skin that is caused by chemicals in the leaves of the poison ivy plant. Symptoms of this condition include redness, itching, a rash, and swelling. Do not scratch or rub your skin. Take or apply over-the-counter and prescription medicines only as told by your health care provider. This information is not intended to replace advice given to you by your health care provider. Make sure you discuss any questions you have with your health care provider. Document Revised: 08/24/2021 Document Reviewed: 08/24/2021 Elsevier Patient Education  [place].

## 2022-08-06 NOTE — Progress Notes (Deleted)
Acute Office Visit  Subjective:     Patient ID: Shirley James, female    DOB: 1941/04/07, 81 y.o.   MRN: [id number]  Chief Complaint  Patient presents with   Rash    HPI Patient is in today for ***  ROS      Objective:    BP (!) 157/57   Pulse 92   Ht 5\' 2"  (1.575 m)   Wt 136 lb (61.7 kg)   SpO2 100%   BMI 24.87 kg/m  {Vitals History (Optional):23777}  Physical Exam  No results found for any visits on 08/06/22.      Assessment & Plan:   Problem List Items Addressed This Visit   None Visit Diagnoses     Rash    -  Primary   Pruritus           No orders of the defined types were placed in this encounter.   No follow-ups on file.  Tandy Gaw, PA-C

## 2022-08-06 NOTE — Progress Notes (Signed)
Acute Office Visit  Subjective:     Patient ID: Shirley James, female    DOB: May 27, 1941, 81 y.o.   MRN: [id number]  Chief Complaint  Patient presents with   Rash    Rash   Pt is an 81 y/o F presenting to clinic with a pruritic non-painful rash onset 2 weeks ago when working in the yard. It started on her neck and spread to the chest and bilateral arms. She tried an OTC itch cream and wiping it with alcohol without relief. Denies any new medications or products. No fever, chills, body aches, SOB.   Marland Kitchen. 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 pylori infection 03/17/2016   Past Medical History:  Diagnosis Date   Hypertension      Review of Systems  Skin:  Positive for rash.   See HPI.      Objective:    BP (!) 157/57   Pulse 92   Ht 5\' 2"  (1.575 m)   Wt 136 lb (61.7 kg)   SpO2 100%   BMI 24.87 kg/m  BP Readings from Last 3 Encounters:  08/06/22 (!) 157/57  01/21/22 (!) 147/58  12/24/21 (!) 143/54      Physical Exam Constitutional:      Appearance: Normal appearance.  Skin:    Findings: Rash present.     Comments: Pruritic papules along bilateral upper extremities, neck, and chest  with an erythematous plaque on right wrist          Assessment & Plan:   Marland KitchenMarland KitchenDavid was seen today for rash.  Diagnoses and all orders for this visit:  Poison ivy dermatitis -     triamcinolone cream (KENALOG) 0.1 %; Apply 1 Application topically 2 (two) times daily. As needed over poison ivy rash. -     methylPREDNISolone sodium succinate (SOLU-MEDROL) 125 mg/2 mL injection 125 mg  Pruritus -     triamcinolone cream (KENALOG) 0.1 %; Apply 1 Application topically 2 (two) times daily. As needed over poison ivy rash. -     methylPREDNISolone sodium succinate (SOLU-MEDROL) 125 mg/2 mL injection 125 mg    Rash appears like poison ivy Treated with solumedrol 125mg  injection and as needed triamcinolone for topical use Cool compresses can help with itch Follow up as needed or if symptoms persist or worsen  Tandy Gaw, PA-C

## 2022-09-27 ENCOUNTER — Telehealth: Payer: Self-pay | Admitting: Neurology

## 2022-09-27 NOTE — Telephone Encounter (Signed)
[name], patient's daughter, called and left vm wanting to discuss some things and get some direction.   I called [name] back [phone]) and she states that her mom is having more issues with memory. She has been on a slow decline, but lately it seems faster and more apparent. She does not think she is taking her Aricept, but she will not let anyone help her with medication management. She seems to be taking her anger and frustration out on her husband. Having a lot of mood swings.   For example with her memory:  She visited her mother this weekend and she states the dishwasher started foaming out of the sides, she asked her what was happening and she had used liquid dish soap in the dryer.  A couple weeks ago she had the tide pods for the laundry she was using for dishes and the cascade pods in the laundry room for clothes. She argued with [name] when she tried to correct that.  They have a trip planned to [place] next year and she keeps telling her that she went with her friends years ago but she has never been to [place].  She is still driving, but forgot how to start her car.  Has been going to the same hairdresser for 20+ years but doesn't know how to get there now.   I advised [name] to schedule a visit to discuss all her concerns along with her mom/dad. Visit has been scheduled for a 40 minute spot. [place].

## 2022-10-06 ENCOUNTER — Other Ambulatory Visit: Payer: Self-pay | Admitting: Physician Assistant

## 2022-10-06 DIAGNOSIS — R0989 Other specified symptoms and signs involving the circulatory and respiratory systems: Secondary | ICD-10-CM

## 2022-10-08 ENCOUNTER — Other Ambulatory Visit: Payer: Self-pay | Admitting: Physician Assistant

## 2022-10-08 ENCOUNTER — Encounter: Payer: Self-pay | Admitting: Physician Assistant

## 2022-10-08 ENCOUNTER — Ambulatory Visit (INDEPENDENT_AMBULATORY_CARE_PROVIDER_SITE_OTHER): Payer: Medicare HMO | Admitting: Physician Assistant

## 2022-10-08 VITALS — BP 138/57 | HR 88

## 2022-10-08 DIAGNOSIS — E559 Vitamin D deficiency, unspecified: Secondary | ICD-10-CM

## 2022-10-08 DIAGNOSIS — F330 Major depressive disorder, recurrent, mild: Secondary | ICD-10-CM

## 2022-10-08 DIAGNOSIS — G3184 Mild cognitive impairment, so stated: Secondary | ICD-10-CM

## 2022-10-08 DIAGNOSIS — Z79899 Other long term (current) drug therapy: Secondary | ICD-10-CM | POA: Diagnosis not present

## 2022-10-08 DIAGNOSIS — E7800 Pure hypercholesterolemia, unspecified: Secondary | ICD-10-CM

## 2022-10-08 DIAGNOSIS — Z1329 Encounter for screening for other suspected endocrine disorder: Secondary | ICD-10-CM

## 2022-10-08 DIAGNOSIS — I10 Essential (primary) hypertension: Secondary | ICD-10-CM | POA: Diagnosis not present

## 2022-10-08 DIAGNOSIS — R4189 Other symptoms and signs involving cognitive functions and awareness: Secondary | ICD-10-CM | POA: Diagnosis not present

## 2022-10-08 DIAGNOSIS — R454 Irritability and anger: Secondary | ICD-10-CM

## 2022-10-08 MED ORDER — VORTIOXETINE HBR 5 MG PO TABS: 5.0000 mg | ORAL_TABLET | Freq: Every day | ORAL | 2 refills | Status: DC

## 2022-10-08 NOTE — Patient Instructions (Addendum)
Dementia Dementia is a condition that affects the way the brain functions. It often affects memory and thinking. Usually, dementia gets worse with time and cannot be reversed (progressive dementia). There are many types of dementia, including: Alzheimer's disease. This type is the most common. Vascular dementia. This type may happen as the result of a stroke. Lewy body dementia. This type may happen to people who have Parkinson's disease. Frontotemporal dementia. This type is caused by damage to nerve cells (neurons) in certain parts of the brain. Some people may be affected by more than one type of dementia. This is called mixed dementia. What are the causes? Dementia is caused by damage to cells in the brain. The area of the brain and the types of cells damaged determine the type of dementia. Usually, this damage is irreversible or cannot be undone. Some examples of irreversible causes include: Conditions that affect the blood vessels of the brain, such as diabetes, heart disease, or blood vessel disease. Genetic mutations. In some cases, changes in the brain may be caused by another condition and can be reversed or slowed. Some examples of reversible causes include: Injury to the brain. Certain medicines. Infection, such as meningitis. Metabolic problems, such as vitamin B12 deficiency or thyroid disease. Pressure on the brain, such as from a tumor, blood clot, or too much fluid in the brain (hydrocephalus). Autoimmune diseases that affect the brain or arteries, such as limbic encephalitis or vasculitis. What are the signs or symptoms? Symptoms of dementia depend on the type of dementia. Common signs of dementia include problems with remembering, thinking, problem solving, decision making, and communicating. These signs develop slowly or get worse with time. This may include: Problems remembering events or people. Having trouble taking a bath or putting clothes on. Forgetting appointments or  forgetting to pay bills. Difficulty planning and preparing meals. Having trouble speaking. Getting lost easily. Changes in behavior or mood. How is this diagnosed? This condition is diagnosed by a specialist (neurologist). It is diagnosed based on the history of your symptoms, your medical history, a physical exam, and tests. Tests may include: Tests to evaluate brain function, such as memory tests, cognitive tests, and other tests. Lab tests, such as blood or urine tests. Imaging tests, such as a CT scan, a PET scan, or an MRI. Genetic testing. This may be done if other family members have a diagnosis of certain types of dementia. Your health care provider will talk with you and your family, friends, or caregivers about your history and symptoms. How is this treated? Treatment for this condition depends on the cause of the dementia. Progressive dementias, such as Alzheimer's disease, cannot be cured, but there may be treatments that help to manage symptoms. Treatment might involve taking medicines that may help to: Control the dementia. Slow down the progression of the dementia. Manage symptoms. In some cases, treating the cause of your dementia can improve symptoms, reverse symptoms, or slow down how quickly your dementia becomes worse. Your health care provider can direct you to support groups, organizations, and other health care providers who can help with decisions about your care. Follow these instructions at home: Medicines Take over-the-counter and prescription medicines only as told by your health care provider. Use a pill organizer or pill reminder to help you manage your medicines. Avoid taking medicines that can affect thinking, such as pain medicines or sleeping medicines. Lifestyle Make healthy lifestyle choices. Be physically active as told by your health care provider. Do not use any  products that contain nicotine or tobacco, such as cigarettes, e-cigarettes, and chewing  tobacco. If you need help quitting, ask your health care provider. Do not drink alcohol. Practice stress-management techniques when you get stressed. Spend time with other people. Make sure to get quality sleep. These tips can help you get a good night's rest: Avoid napping during the day. Keep your sleeping area dark and cool. Avoid exercising during the few hours before you go to bed. Avoid caffeine products in the evening. Eating and drinking Drink enough fluid to keep your urine pale yellow. Eat a healthy diet. General instructions  Work with your health care provider to determine what you need help with and what your safety needs are. Talk with your health care provider about whether it is safe for you to drive. If you were given a bracelet that identifies you as a person with memory loss or tracks your location, make sure to wear it at all times. Work with your family to make important decisions, such as advance directives, medical power of attorney, or a living will. Keep all follow-up visits. This is important. Where to find more information Alzheimer's Association: [employer]nz National Institute on Aging: DVDEnthusiasts.nl World Health Organization: RoleLink.com.br Contact a health care provider if: You have any new or worsening symptoms. You have problems with choking or swallowing. Get help right away if: You feel depressed or sad, or feel that you want to harm yourself. Your family members become concerned for your safety. If you ever feel like you may hurt yourself or others, or have thoughts about taking your own life, get help right away. Go to your nearest emergency department or: Call your local emergency services (911 in the [place] Call a suicide crisis helpline, such as the [facility] at 3076117013 or 988 in the [place]. This is open 24 hours a day in the [place] Text the Crisis Text Line at 947-666-9240 (in the [place].). Summary Dementia is a  condition that affects the way the brain functions. Dementia often affects memory and thinking. Usually, dementia gets worse with time and cannot be reversed (progressive dementia). Treatment for this condition depends on the cause of the dementia. Work with your health care provider to determine what you need help with and what your safety needs are. Your health care provider can direct you to support groups, organizations, and other health care providers who can help with decisions about your care. This information is not intended to replace advice given to you by your health care provider. Make sure you discuss any questions you have with your health care provider. Document Revised: 06/03/2021 Document Reviewed: 03/24/2020 Elsevier Patient Education  [place]. Memantine Tablets What is this medication? MEMANTINE (MEM an teen) treats memory loss and confusion (dementia) in people who have Alzheimer disease. It works by improving attention, memory, and the ability to engage in daily activities. It is not a cure for dementia or Alzheimer disease. This medicine may be used for other purposes; ask your health care provider or pharmacist if you have questions. COMMON BRAND NAME(S): Namenda What should I tell my care team before I take this medication? They need to know if you have any of these conditions: Kidney disease Liver disease Seizures Trouble passing urine An unusual or allergic reaction to memantine, other medications, foods, dyes, or preservatives Pregnant or trying to get pregnant Breast-feeding How should I use this medication? Take this medication by mouth with water. Follow the directions on the prescription label.  You may take this medication with or without food. Take your doses at regular intervals. Do not take your medication more often than directed. Continue to take your medication even if you feel better. Do not stop taking except on the advice of your care  team. Talk to your care team about the use of this medication in children. Special care may be needed Overdosage: If you think you have taken too much of this medicine contact a poison control center or emergency room at once. NOTE: This medicine is only for you. Do not share this medicine with others. What if I miss a dose? If you miss a dose, take it as soon as you can. If it is almost time for your next dose, take only that dose. Do not take double or extra doses. If you do not take your medication for several days, contact your care team. Your dose may need to be changed. What may interact with this medication? Acetazolamide Amantadine Cimetidine Dextromethorphan Dofetilide Hydrochlorothiazide Ketamine Metformin Methazolamide Quinidine Ranitidine Sodium bicarbonate Triamterene This list may not describe all possible interactions. Give your health care provider a list of all the medicines, herbs, non-prescription drugs, or dietary supplements you use. Also tell them if you smoke, drink alcohol, or use illegal drugs. Some items may interact with your medicine. What should I watch for while using this medication? Visit your care team for regular checks on your progress. Check with your care team if there is no improvement in your symptoms or if they get worse. This medication may affect your coordination, reaction time, or judgment. Do not drive or operate machinery until you know how this medication affects you. Sit up or stand slowly to reduce the risk of dizzy or fainting spells. Drinking alcohol with this medication can increase the risk of these side effects. What side effects may I notice from receiving this medication? Side effects that you should report to your care team as soon as possible: Allergic reactions--skin rash, itching, hives, swelling of the face, lips, tongue, or throat Side effects that usually do not require medical attention (report to your care team if they  continue or are bothersome): Confusion Constipation Diarrhea Dizziness Headache This list may not describe all possible side effects. Call your doctor for medical advice about side effects. You may report side effects to FDA at 1-800-FDA-1088. Where should I keep my medication? Keep out of the reach of children. Store at room temperature between 15 degrees and 30 degrees C (59 degrees and 86 degrees F). Throw away any unused medication after the expiration date. NOTE: This sheet is a summary. It may not cover all possible information. If you have questions about this medicine, talk to your doctor, pharmacist, or health care provider.  2023 Elsevier/Gold Standard (2021-11-26 00:00:00) Memory Compensation Strategies  Use "WARM" strategy.  W= write it down  A= associate it  R= repeat it  M= make a mental note  2.   You can keep a Social worker.  Use a 3-ring notebook with sections for the following: calendar, important names and phone numbers,  medications, doctors' names/phone numbers, lists/reminders, and a section to journal what you did  each day.   3.    Use a calendar to write appointments down.  4.    Write yourself a schedule for the day.  This can be placed on the calendar or in a separate section of the Memory Notebook.  Keeping a  regular schedule can help memory.  5.  Use medication organizer with sections for each day or morning/evening pills.  You may need help loading it  6.    Keep a basket, or pegboard by the door.  Place items that you need to take out with you in the basket or on the pegboard.  You may also want to  include a message board for reminders.  7.    Use sticky notes.  Place sticky notes with reminders in a place where the task is performed.  For example: " turn off the  stove" placed by the stove, "lock the door" placed on the door at eye level, " take your medications" on  the bathroom mirror or by the place where you normally take your  medications.  8.    Use alarms/timers.  Use while cooking to remind yourself to check on food or as a reminder to take your medicine, or as a  reminder to make a call, or as a reminder to perform another task, etc.

## 2022-10-08 NOTE — Telephone Encounter (Signed)
PA

## 2022-10-08 NOTE — Progress Notes (Signed)
Established Patient Office Visit  Subjective   Patient ID: Shirley James, female    DOB: 03/08/1941  Age: 81 y.o. MRN: [id number]  Chief Complaint  Patient presents with   Memory Loss         HPI Pt is a 81 yo female who presents to the clinic with her 2 daughters and husband to discuss memory. Her short term memory is worsening. She is on aricept. Family is finding she is becoming more irritable as well and they are more concerned about her making mistakes like washing the dishes with tide pods instead of cascade.   She has seen memory specialist in the past and last MOCA was 23/30. Last CT was 2021 and showed small vessel ischemic changes and cerebral atrophy.    She is managing her BP, taking lipitor daily.  .. 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 pylori infection 03/17/2016   Past Medical History:  Diagnosis Date   Hypertension       ROS See HPI.    Objective:     BP (!) 138/57   Pulse 88   SpO2 100%  BP Readings from Last 3 Encounters:  10/08/22 (!) 138/57  08/06/22 (!) 157/57   01/21/22 (!) 147/58   Wt Readings from Last 3 Encounters:  08/06/22 136 lb (61.7 kg)  09/23/21 135 lb (61.2 kg)  06/23/21 128 lb (58.1 kg)    ..    10/08/2022   10:34 AM 12/29/2020    9:11 AM 12/24/2020    9:24 AM 12/04/2018   12:03 PM 07/11/2017   10:25 AM  Depression screen PHQ 2/9  Decreased Interest 0 0 0 0 0  Down, Depressed, Hopeless 0 0 0 0 0  PHQ - 2 Score 0 0 0 0 0  Altered sleeping  0   0  Tired, decreased energy  0   0  Change in appetite  0   0  Feeling bad or failure about yourself   0   0  Trouble concentrating  0   0  Moving slowly or fidgety/restless  0   0  Suicidal thoughts  0   0  PHQ-9 Score  0   0   .Marland Kitchen    10/08/2022   11:24 AM  Montreal Cognitive Assessment   Visuospatial/ Executive (0/5) 3  Naming (0/3) 3  Attention: Read list of digits (0/2) 1  Attention: Read list of letters (0/1) 1  Attention: Serial 7 subtraction starting at 100 (0/3) 1  Language: Repeat phrase (0/2) 2  Language : Fluency (0/1) 0  Abstraction (0/2) 2  Delayed Recall (  0/5) 0  Orientation (0/6) 2  Total 15  Adjusted Score (based on education) 15   .Marland Kitchen    02/04/2017   11:41 AM  MMSE - Mini Mental State Exam  Orientation to time 5  Orientation to Place 5  Registration 3  Attention/ Calculation 5  Recall 3  Language- name 2 objects 2  Language- repeat 1  Language- follow 3 step command 3  Language- read & follow direction 1  Write a sentence 1  Copy design 1  Total score 30      Physical Exam Constitutional:      Appearance: Normal appearance.  HENT:     Head: Normocephalic.  Musculoskeletal:     Right lower leg: No edema.     Left lower leg: No edema.  Neurological:     General: No focal deficit present.     Mental Status: She is alert and oriented to person, place, and time.  Psychiatric:        Mood and Affect: Mood normal.        Assessment & Plan:  Marland KitchenMarland KitchenTangi was seen today for memory loss.  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  Elevated LDL cholesterol level -     Lipid Panel w/reflex Direct LDL  Essential hypertension, benign -     COMPLETE METABOLIC PANEL WITH GFR  Vitamin D deficiency -     Vitamin D (25 hydroxy)  Thyroid disorder screen -     TSH  Medication management -     TSH -     Lipid Panel w/reflex Direct LDL -     COMPLETE METABOLIC PANEL WITH GFR -     CBC with Differential/Platelet -     Vitamin D (25 hydroxy)  Mild episode of recurrent major depressive disorder (HCC) -     vortioxetine HBr (TRINTELLIX) 5 MG TABS tablet; Take 1 tablet (5 mg total) by mouth daily.  Irritability -     vortioxetine HBr (TRINTELLIX) 5 MG TABS tablet; Take 1 tablet (5 mg total) by mouth daily. -     Ambulatory referral to Neurology  Moderate cognitive impairment -     TSH -     Lipid Panel w/reflex Direct LDL -     COMPLETE METABOLIC PANEL WITH GFR -     CBC with Differential/Platelet -     Vitamin D (25 hydroxy) -     B12 and Folate Panel -     vortioxetine HBr (TRINTELLIX) 5 MG TABS tablet; Take 1 tablet (5 mg total) by mouth daily. -     Ambulatory referral to Neurology   [facility] has declined some from 23 in 2022 and 15 today.   Referral was placed for neurology and memory care  Was instructed to call and schedule with [facility] [phone]  Continue on aricept 5mg  daily Discussed namenda with family, will hold for now Added trintellix 5mg  daily for depressed mood, irritability, cognition Fasting labs ordered today Will make sure vitamin B12 and vitamin D are  to goal Discussed strategies to help her remember and importance of socialization  Spent 55 minutes with patient  and family reviewing chart, discussing plan, discussing medications, giving strategies for help during these changes.   Tandy Gaw, PA-C

## 2022-10-09 LAB — CBC WITH DIFFERENTIAL/PLATELET
Absolute Monocytes: 365 cells/uL (ref 200–950)
Basophils Absolute: 48 cells/uL (ref 0–200)
Basophils Relative: 1.5 %
Eosinophils Absolute: 48 cells/uL (ref 15–500)
Eosinophils Relative: 1.5 %
HCT: 39.2 % (ref 35.0–45.0)
Hemoglobin: 13.3 g/dL (ref 11.7–15.5)
Lymphs Abs: 1472 cells/uL (ref 850–3900)
MCH: 31.8 pg (ref 27.0–33.0)
MCHC: 33.9 g/dL (ref 32.0–36.0)
MCV: 93.8 fL (ref 80.0–100.0)
MPV: 8.9 fL (ref 7.5–12.5)
Monocytes Relative: 11.4 %
Neutro Abs: 1267 cells/uL — ABNORMAL LOW (ref 1500–7800)
Neutrophils Relative %: 39.6 %
Platelets: 316 10*3/uL (ref 140–400)
RBC: 4.18 10*6/uL (ref 3.80–5.10)
RDW: 12.8 % (ref 11.0–15.0)
Total Lymphocyte: 46 %
WBC: 3.2 10*3/uL — ABNORMAL LOW (ref 3.8–10.8)

## 2022-10-09 LAB — COMPLETE METABOLIC PANEL WITH GFR
AG Ratio: 1.4 (calc) (ref 1.0–2.5)
ALT: 16 U/L (ref 6–29)
AST: 19 U/L (ref 10–35)
Albumin: 4.6 g/dL (ref 3.6–5.1)
Alkaline phosphatase (APISO): 60 U/L (ref 37–153)
BUN: 14 mg/dL (ref 7–25)
CO2: 33 mmol/L — ABNORMAL HIGH (ref 20–32)
Calcium: 9.9 mg/dL (ref 8.6–10.4)
Chloride: 100 mmol/L (ref 98–110)
Creat: 0.81 mg/dL (ref 0.60–0.95)
Globulin: 3.4 g/dL (calc) (ref 1.9–3.7)
Glucose, Bld: 100 mg/dL — ABNORMAL HIGH (ref 65–99)
Potassium: 3.8 mmol/L (ref 3.5–5.3)
Sodium: 140 mmol/L (ref 135–146)
Total Bilirubin: 0.5 mg/dL (ref 0.2–1.2)
Total Protein: 8 g/dL (ref 6.1–8.1)
eGFR: 73 mL/min/{1.73_m2} (ref >=60)

## 2022-10-09 LAB — LIPID PANEL W/REFLEX DIRECT LDL
Cholesterol: 173 mg/dL (ref <200)
HDL: 89 mg/dL (ref >=50)
LDL Cholesterol (Calc): 65 mg/dL (calc)
Non-HDL Cholesterol (Calc): 84 mg/dL (calc) (ref <130)
Total CHOL/HDL Ratio: 1.9 (calc) (ref <5.0)
Triglycerides: 108 mg/dL (ref <150)

## 2022-10-09 LAB — VITAMIN D 25 HYDROXY (VIT D DEFICIENCY, FRACTURES): Vit D, 25-Hydroxy: 42 ng/mL (ref 30–100)

## 2022-10-09 LAB — B12 AND FOLATE PANEL
Folate: >24 ng/mL
Vitamin B-12: 1691 pg/mL — ABNORMAL HIGH (ref 200–1100)

## 2022-10-09 LAB — TSH: TSH: 1.52 mIU/L (ref 0.40–4.50)

## 2022-10-11 ENCOUNTER — Telehealth: Payer: Self-pay

## 2022-10-11 NOTE — Telephone Encounter (Signed)
Initiated Prior authorization for: Via: Covermymeds Case/Key:Trintellix (formerly Brintellix) '5MG'$  tablets Status: approved  as of 10/11/22 Reason:11/22/2021 12:00:00 AM, Coverage Ends on: 11/22/2023 Notified Pt via: Mychart

## 2022-10-11 NOTE — Progress Notes (Signed)
Camay,   Thyroid looks great.  Cholesterol looks fabulous.  Vitamin D is good. Stay on same dose of vitamin D.  B12 is too high. Cut back by half on b12 supplements. Recheck in 3 months.  Kidney and liver look great.

## 2022-10-25 ENCOUNTER — Telehealth: Payer: Self-pay | Admitting: Neurology

## 2022-10-25 NOTE — Telephone Encounter (Signed)
Patient's daughter Ishmael Holter called and LVM checking on memory care referral. They state they have not heard anything. Can we follow up with her? [phone].

## 2022-10-25 NOTE — Telephone Encounter (Signed)
Referral was sent to incorrect fax number, resending to correct fax number and will update patient accordingly. Shirley James

## 2023-01-03 ENCOUNTER — Other Ambulatory Visit: Payer: Self-pay | Admitting: Physician Assistant

## 2023-01-03 DIAGNOSIS — I10 Essential (primary) hypertension: Secondary | ICD-10-CM

## 2023-01-28 IMAGING — CT CT HEAD W/O CM
4 series · 16 of 47 positions shown, 18 images · non-contrast
Comparison: No pertinent prior exams available for comparison.

CLINICAL DATA: Dizziness, nonspecific. Dizziness. Additional
history provided by scanning technologist: Patient reports vertigo
like symptoms for several weeks.

EXAM:
CT HEAD WITHOUT CONTRAST
TECHNIQUE: Contiguous axial images were obtained from the base of the skull
through the vertex without intravenous contrast.

[Series 2: head wo · axial · 0.43mm/px · z∈[+105,+220]mm · 7 of 31 slices shown, 9 images]
[im 4/31  brain]
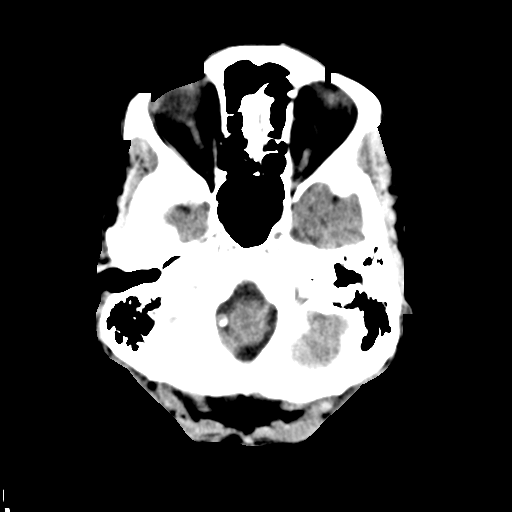
[im 4/31  bone]
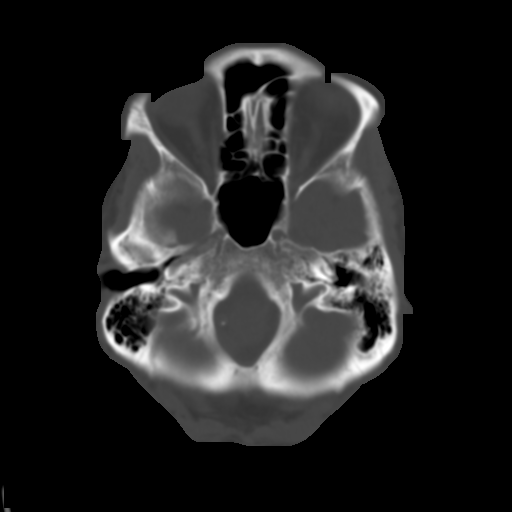
[im 8/31  brain]
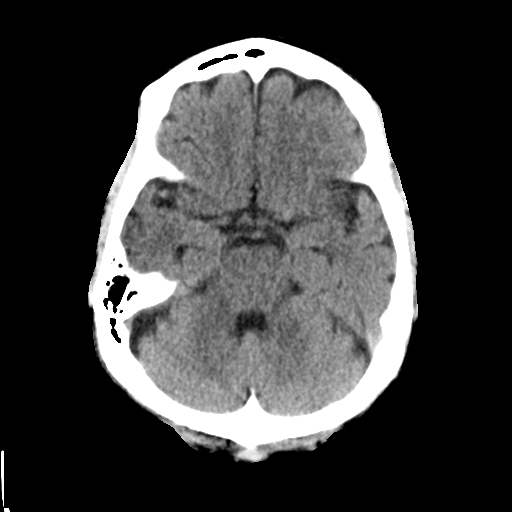
[im 12/31  brain]
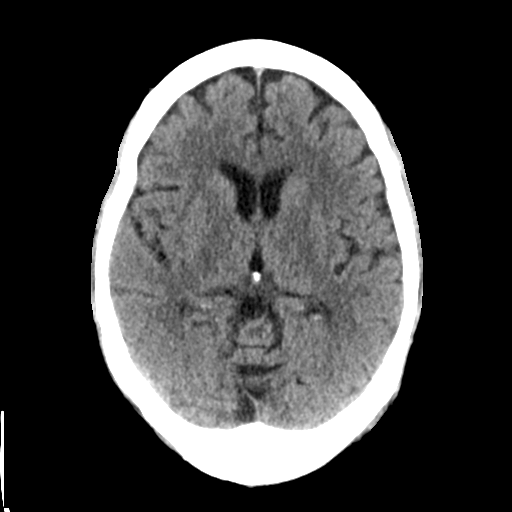
[im 16/31  brain]
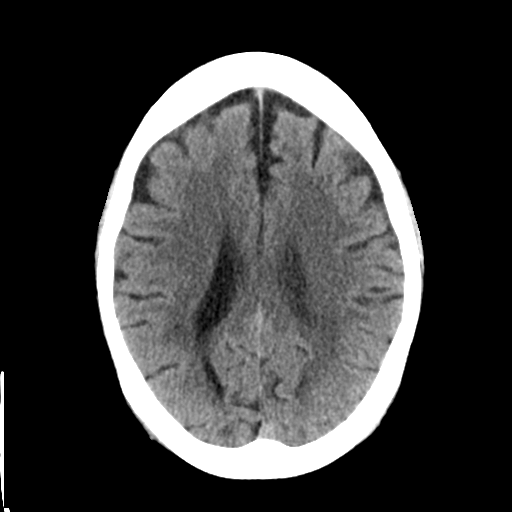
[im 19/31  brain]
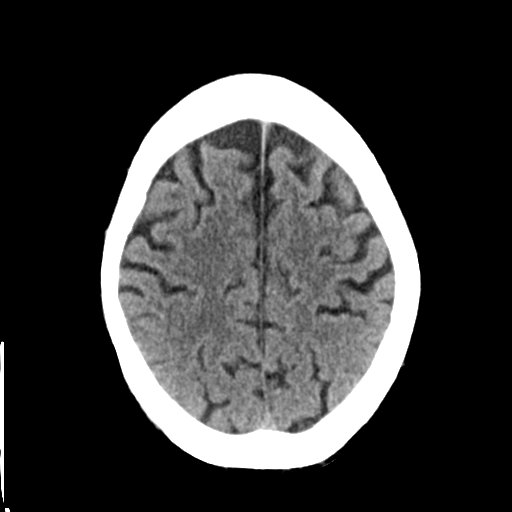
[im 19/31  bone]
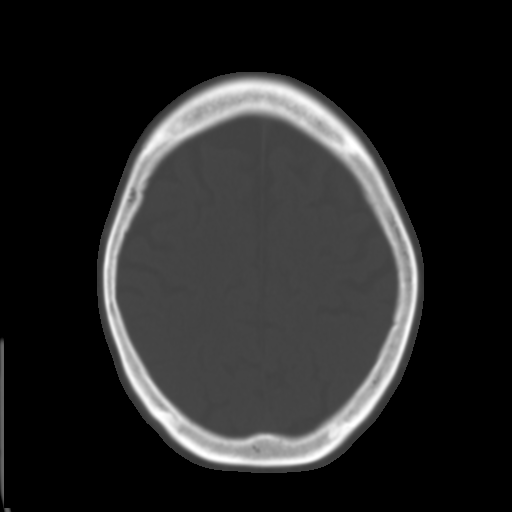
[im 23/31  brain]
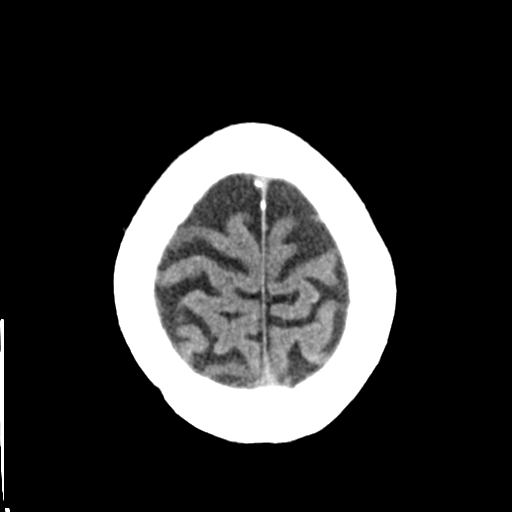
[im 27/31  brain]
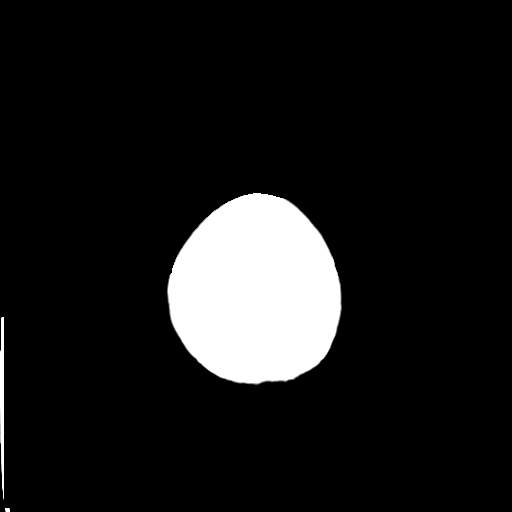

[Series 3: head bone 2x2 · axial · 0.43mm/px · z∈[+104,+134]mm · 3 of 76 slices shown]
[im 8/76  bone]
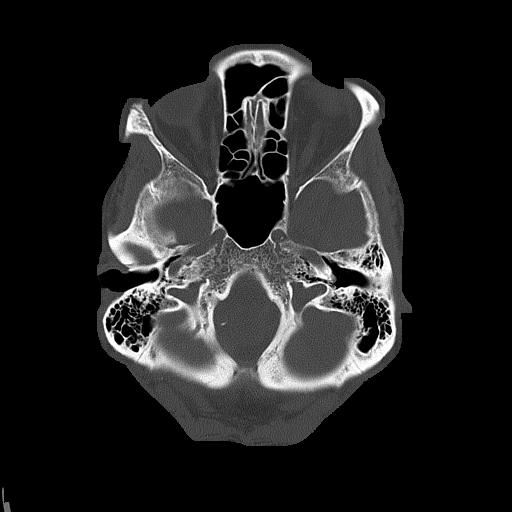
[im 16/76  bone]
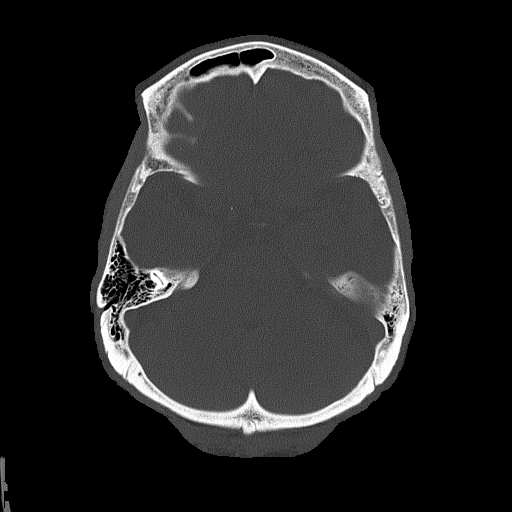
[im 23/76  bone]
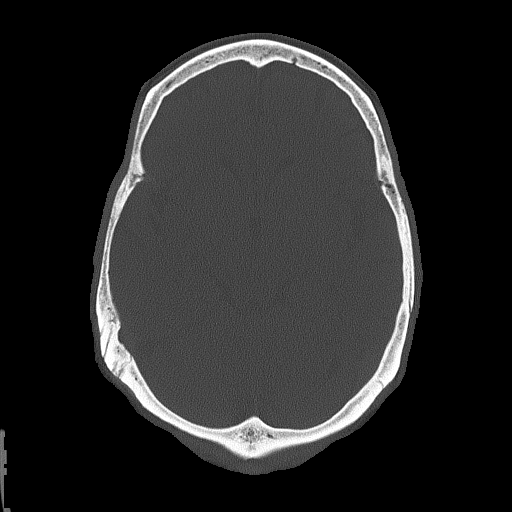

[Series 4: head wo coronal · coronal · 0.29mm/px · 3 of 70 slices shown]
[im 24/70  brain]
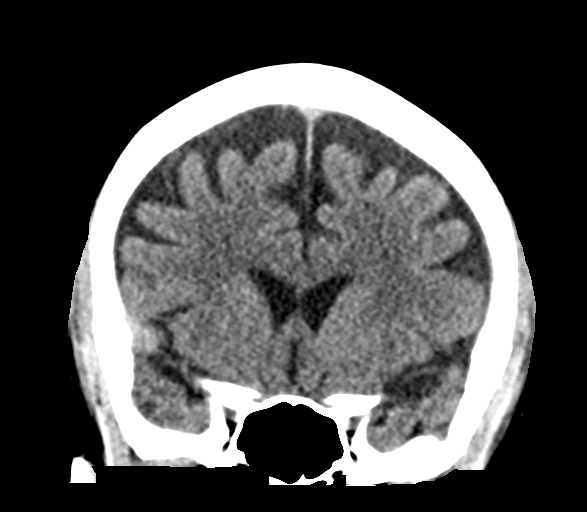
[im 31/70  brain]
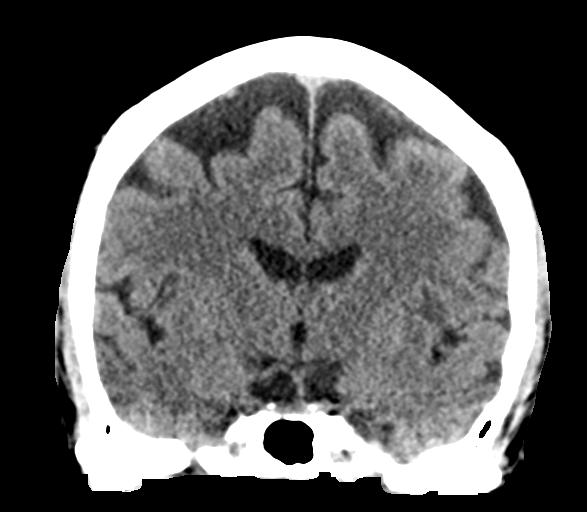
[im 39/70  brain]
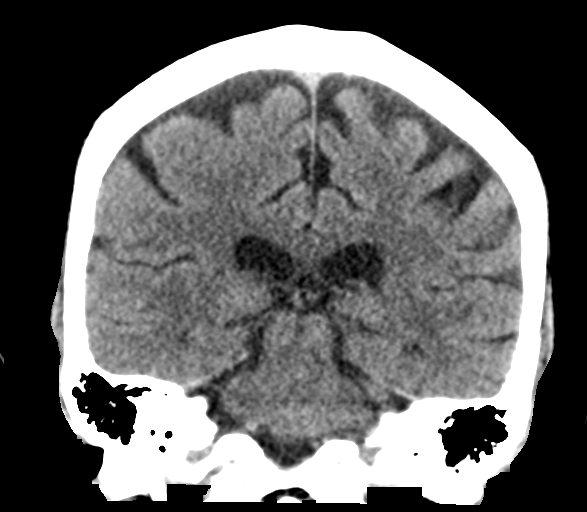

[Series 5: head wo sagittal · sagittal · 0.31mm/px · 3 of 57 slices shown]
[im 19/57  brain]
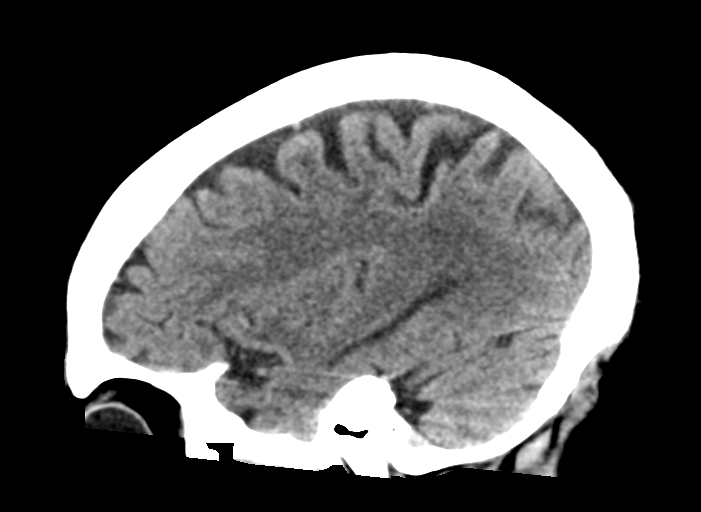
[im 29/57  brain]
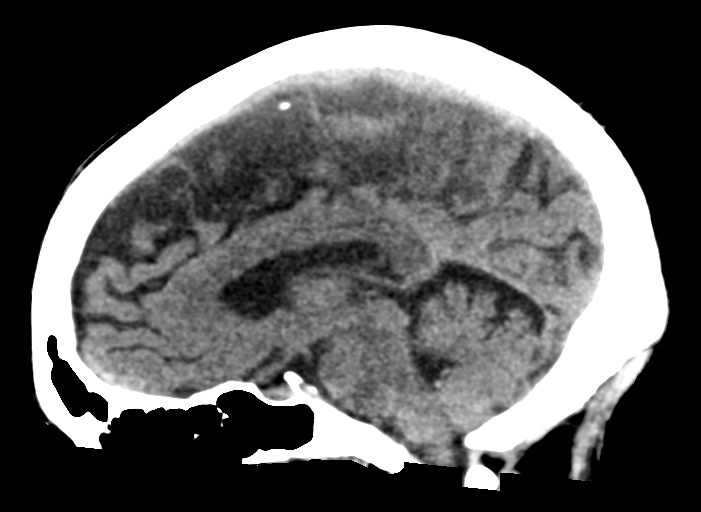
[im 38/57  brain]
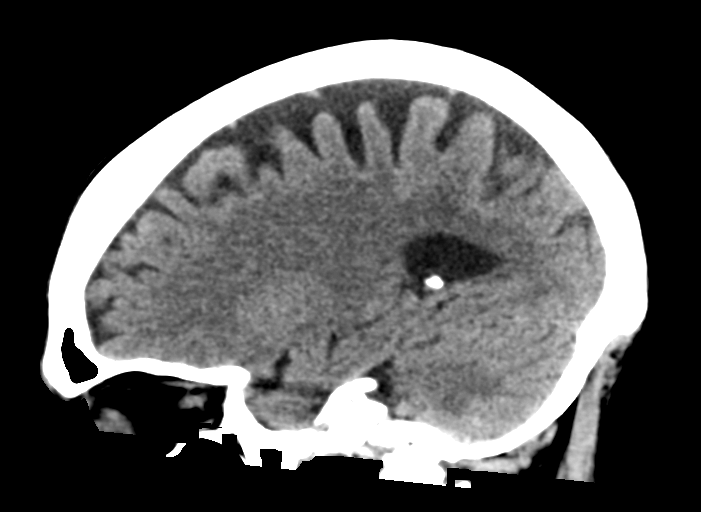

[16 of 47 positions shown; findings below may reference images not displayed]

FINDINGS: Brain:

Mild generalized cerebral atrophy.

Minimal patchy and ill-defined hypoattenuation within the cerebral
white matter, nonspecific but compatible with chronic small vessel
ischemic disease.

There is no acute intracranial hemorrhage.

No demarcated cortical infarct.

No extra-axial fluid collection.

No evidence of an intracranial mass.

No midline shift.

Vascular: No hyperdense vessel.  Atherosclerotic calcifications.

Skull: Normal. Negative for fracture or focal lesion.

Sinuses/Orbits: Visualized orbits show no acute finding. No
significant paranasal sinus disease.
IMPRESSION: No evidence of acute intracranial abnormality.

Minimal chronic small vessel ischemic changes within the cerebral
white matter.

Mild generalized cerebral atrophy.

## 2023-01-28 IMAGING — US US CAROTID DUPLEX BILAT
1 series · 13 of 24 positions shown · non-contrast
Comparison: None.

CLINICAL DATA: Right carotid bruit

EXAM:
BILATERAL CAROTID DUPLEX ULTRASOUND
TECHNIQUE: Gray scale imaging, color Doppler and duplex ultrasound were
performed of bilateral carotid and vertebral arteries in the neck.

[Series 1: us carotid bilateral · 13 of 67 slices shown]
[im 1/67]
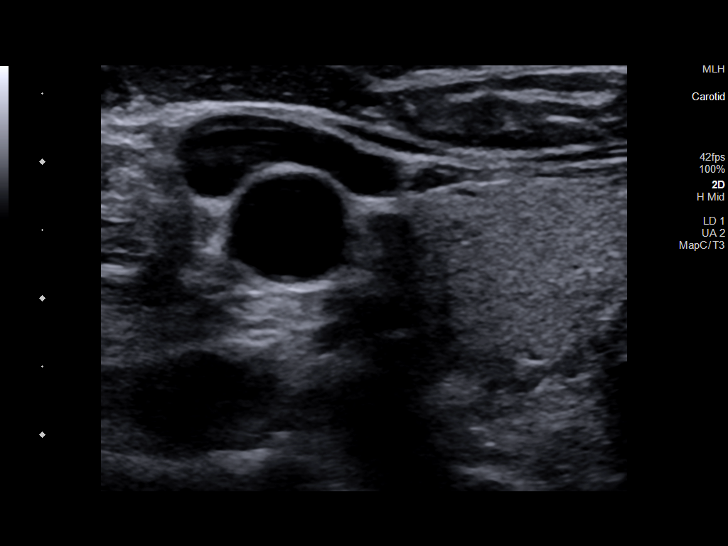
[im 6/67]
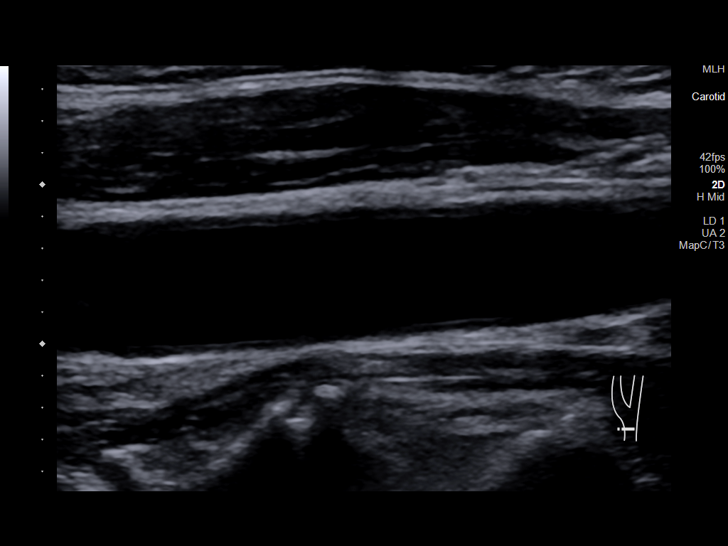
[im 12/67]
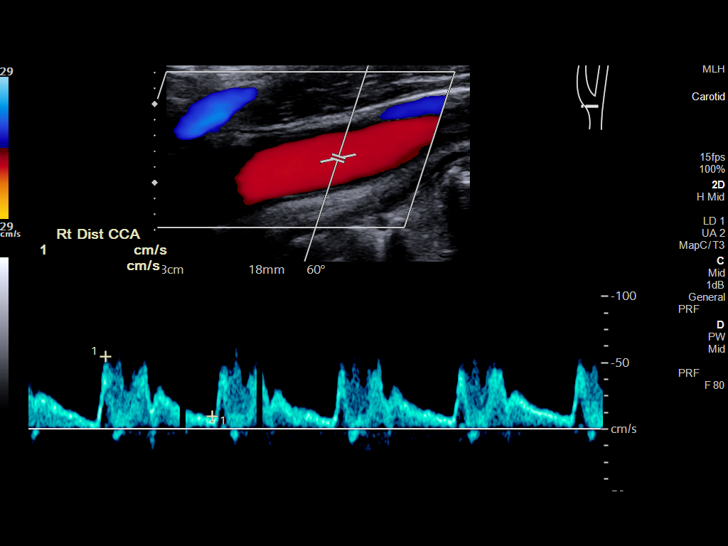
[im 18/67]
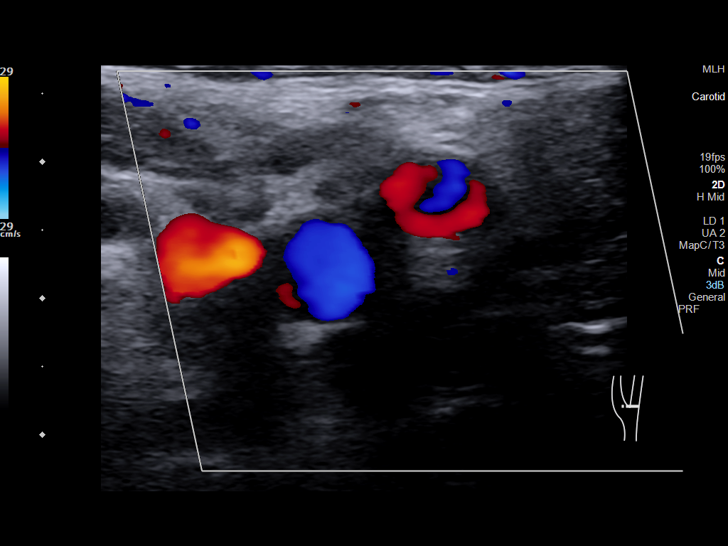
[im 23/67]
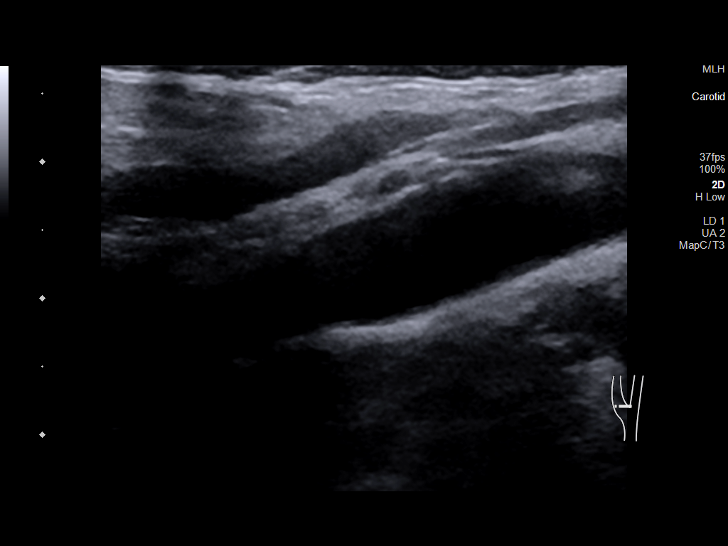
[im 29/67]
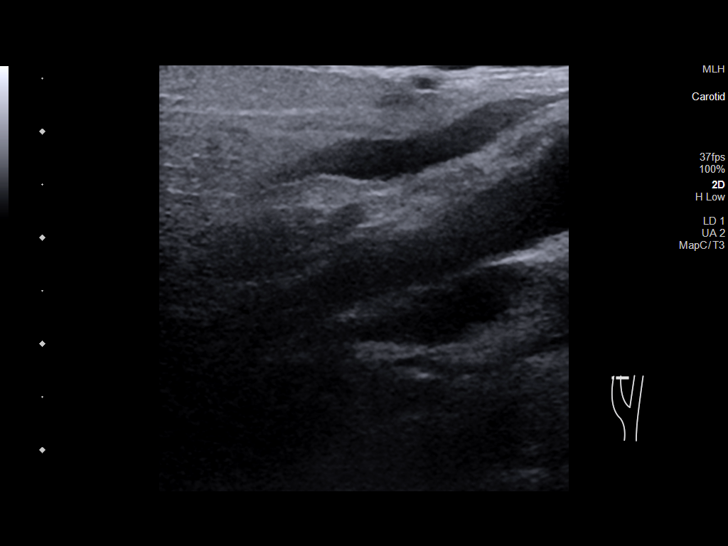
[im 35/67]
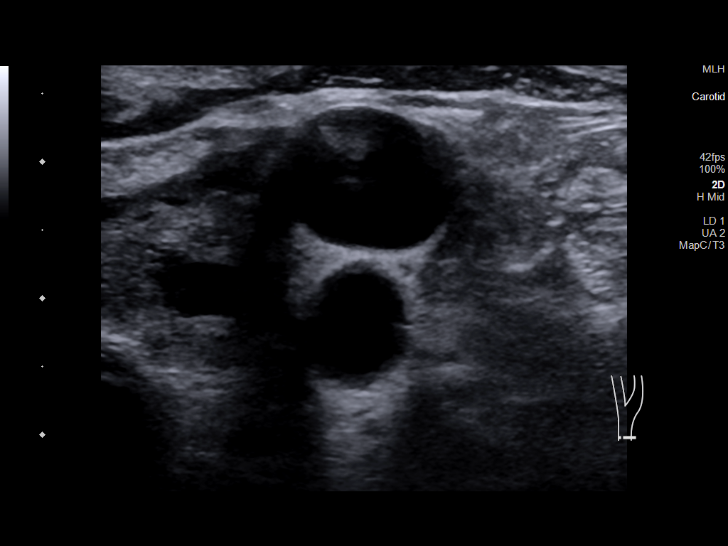
[im 38/67]
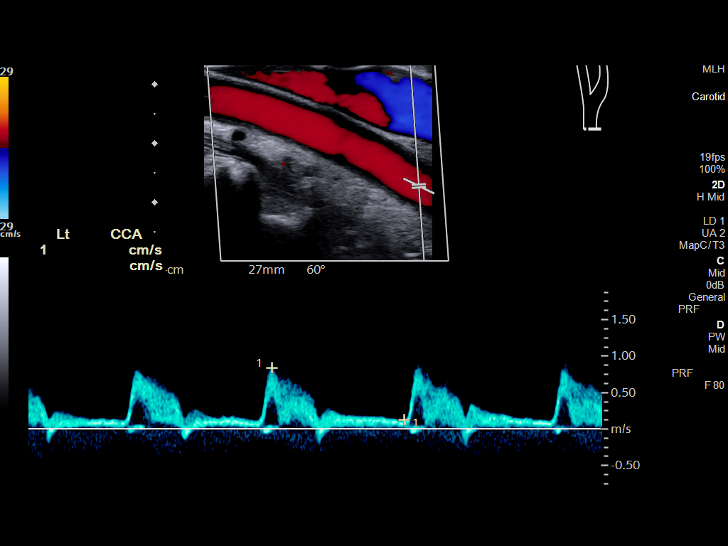
[im 44/67]
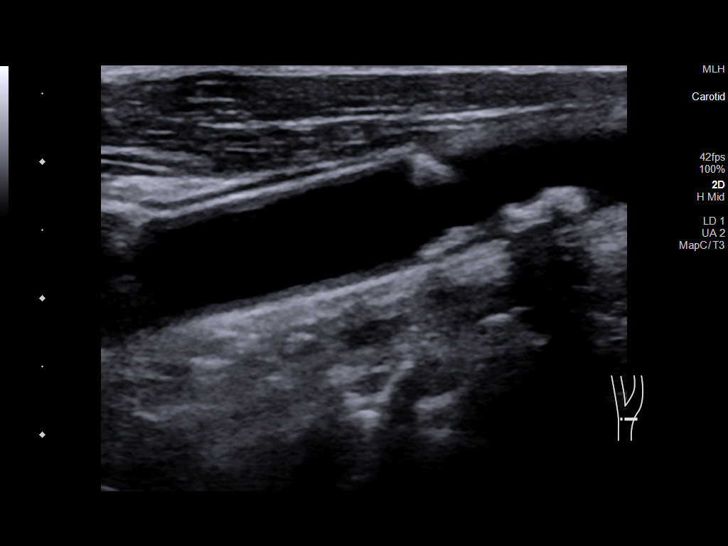
[im 49/67]
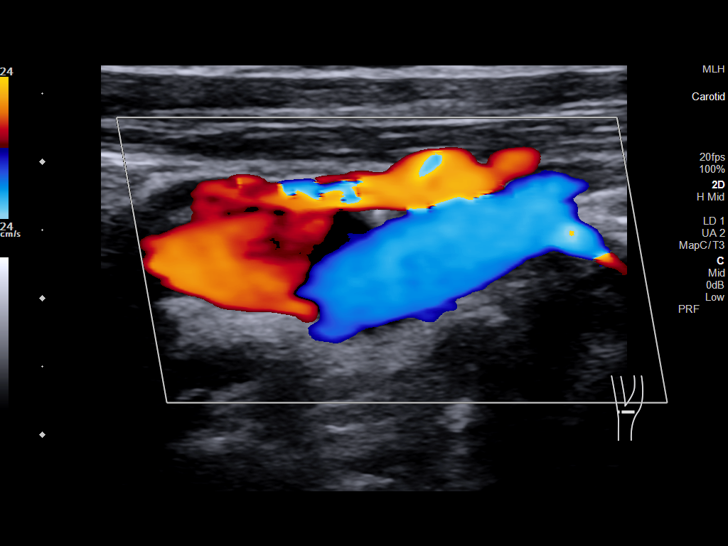
[im 55/67]
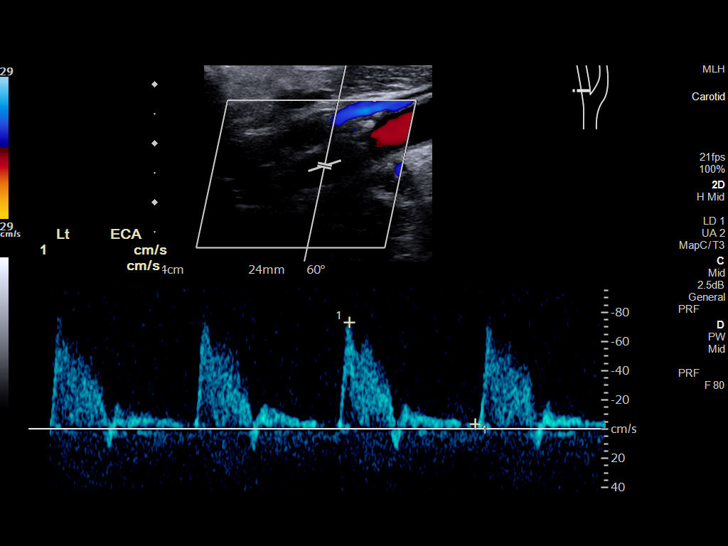
[im 61/67]
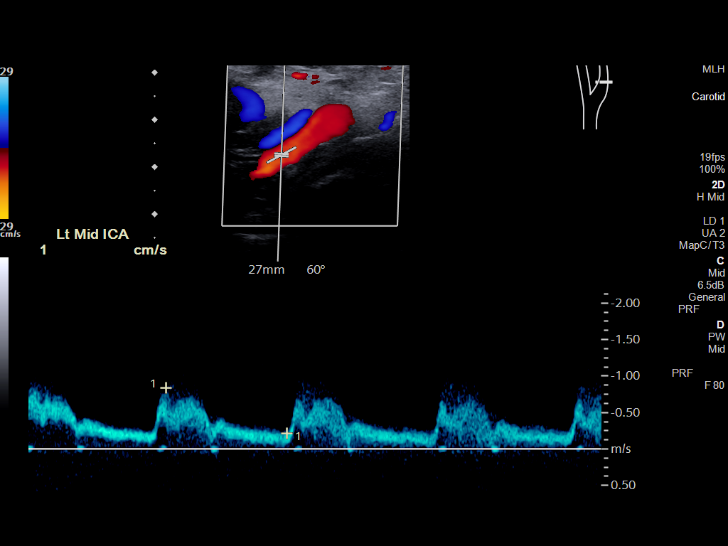
[im 67/67]
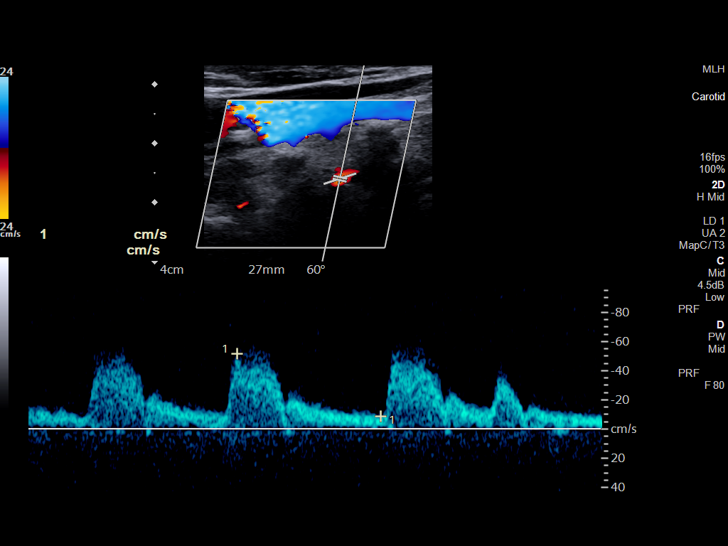

[13 of 24 positions shown; findings below may reference images not displayed]

FINDINGS: Criteria: Quantification of carotid stenosis is based on velocity
parameters that correlate the residual internal carotid diameter
with NASCET-based stenosis levels, using the diameter of the distal
internal carotid lumen as the denominator for stenosis measurement.

The following velocity measurements were obtained:

RIGHT

ICA: 81/23 cm/sec

CCA: 84/9 cm/sec

SYSTOLIC ICA/CCA RATIO:

ECA: 152 cm/sec

LEFT

ICA: 84/16 cm/sec

CCA: 87/13 cm/sec

SYSTOLIC ICA/CCA RATIO:

ECA: 73 cm/sec

RIGHT CAROTID ARTERY: Minor echogenic shadowing plaque formation. No
hemodynamically significant right ICA stenosis, velocity elevation,
or turbulent flow. Degree of narrowing less than 50%.

RIGHT VERTEBRAL ARTERY:  Normal antegrade flow

LEFT CAROTID ARTERY: Similar scattered minor echogenic plaque
formation. No hemodynamically significant left ICA stenosis,
velocity elevation, or turbulent flow.

LEFT VERTEBRAL ARTERY:  Normal antegrade flow

Upper extremity blood pressures: RIGHT: 151/64 LEFT: 141/57
IMPRESSION: Minor carotid atherosclerosis. Negative for significant stenosis.
Degree of narrowing less than 50% bilaterally by ultrasound
criteria.

Patent antegrade vertebral flow bilaterally

## 2023-02-22 ENCOUNTER — Telehealth: Payer: Self-pay | Admitting: Physician Assistant

## 2023-02-22 NOTE — Telephone Encounter (Signed)
Called patient to schedule Medicare Annual Wellness Visit (AWV). Left message for patient to call back and schedule Medicare Annual Wellness Visit (AWV).  Last date of AWV: 2022  Please schedule an appointment at any time with [facility].  If any questions, please contact me at [phone].  Thank you ,  Lin Givens Patient Access Advocate II Direct Dial: [phone]

## 2023-03-02 ENCOUNTER — Telehealth (INDEPENDENT_AMBULATORY_CARE_PROVIDER_SITE_OTHER): Payer: Medicare Other | Admitting: Physician Assistant

## 2023-03-02 ENCOUNTER — Encounter: Payer: Self-pay | Admitting: Physician Assistant

## 2023-03-02 VITALS — Ht 63.0 in | Wt 125.0 lb

## 2023-03-02 DIAGNOSIS — M8589 Other specified disorders of bone density and structure, multiple sites: Secondary | ICD-10-CM | POA: Diagnosis not present

## 2023-03-02 DIAGNOSIS — M25512 Pain in left shoulder: Secondary | ICD-10-CM | POA: Diagnosis not present

## 2023-03-02 DIAGNOSIS — G3184 Mild cognitive impairment, so stated: Secondary | ICD-10-CM | POA: Diagnosis not present

## 2023-03-02 MED ORDER — VITAMIN D 50 MCG (2000 UT) PO CAPS: 1.0000 | ORAL_CAPSULE | Freq: Every day | ORAL | 3 refills | Status: AC

## 2023-03-02 MED ORDER — DONEPEZIL HCL 5 MG PO TABS: ORAL_TABLET | ORAL | 1 refills | Status: DC

## 2023-03-02 NOTE — Progress Notes (Signed)
..  Virtual Visit via Telephone Note  I connected with Shirley James on 03/02/23 at 11:30 AM EDT by telephone and verified that I am speaking with the correct person using two identifiers.  Location: Patient: home Provider: clinic  .Marland KitchenParticipating in visit:  Patient: Shirley James Provider: Tandy Gaw PA-C   I discussed the limitations, risks, security and privacy concerns of performing an evaluation and management service by telephone and the availability of in person appointments. I also discussed with the patient that there may be a patient responsible charge related to this service. The patient expressed understanding and agreed to proceed.   History of Present Illness: Pt is a 82 yo female who calls in with 3 weeks of left shoulder pain. She denies any injury. Pain is not constant but intermittent. She she moves her arm she feels a "pop" in her shoulder area and sometimes a sharp pain. She denies any numbness or tingling or radiation of pain down her arm. There is no tenderness over left shoulder. She denies any heavy lifting or new exercises. Struggles to lift her arms above 90 degrees. No strength changes.   .. Active Ambulatory Problems    Diagnosis Date Noted   Essential hypertension, benign 08/03/2013   Insomnia 08/03/2013   PND (post-nasal drip) 11/07/2013   Heart murmur, systolic 05/13/2014   B12 deficiency 03/23/2015   Vitamin D deficiency 03/23/2015   Other fatigue 03/23/2015   LUQ pain 03/16/2016   Paresthesia of both hands 07/11/2017   Bilateral carpal tunnel syndrome 12/19/2017   Tinnitus of both ears 12/19/2017   Right carotid bruit 12/04/2018   Thyroid nodule 12/13/2018   Osteopenia 12/22/2018   Amnestic MCI (mild cognitive impairment with memory loss) 12/29/2020   Low serum vitamin B12 06/23/2021   Elevated LDL cholesterol level 06/23/2021   Carotid atherosclerosis, bilateral 07/08/2021   Mild episode of recurrent major depressive disorder 10/08/2022    Irritability 10/08/2022   Acute pain of left shoulder 03/02/2023   Resolved Ambulatory Problems    Diagnosis Date Noted   Epigastric pain 03/16/2016   Diarrhea 03/16/2016   H. pylori infection 03/17/2016   Past Medical History:  Diagnosis Date   Hypertension       Observations/Objective: No acute distress   Assessment and Plan: Marland KitchenMarland KitchenTeather was seen today for shoulder pain.  Diagnoses and all orders for this visit:  Acute pain of left shoulder -     DG Shoulder Left; Future  Amnestic MCI (mild cognitive impairment with memory loss) -     donepezil (ARICEPT) 5 MG tablet; TAKE 1 TABLET BY MOUTH EVERYDAY AT BEDTIME  Osteopenia of multiple sites -     Cholecalciferol (VITAMIN D) 50 MCG (2000 UT) CAPS; Take 1 capsule (2,000 Units total) by mouth daily.   Suspect OA of left shoulder causing some impingement Get xray, ordered today Will consider injection if continues or formal PT No need for pain medication since pain is so intermittent   Follow Up Instructions:    I discussed the assessment and treatment plan with the patient. The patient was provided an opportunity to ask questions and all were answered. The patient agreed with the plan and demonstrated an understanding of the instructions.   The patient was advised to call back or seek an in-person evaluation if the symptoms worsen or if the condition fails to improve as anticipated.  I provided 10 minutes of non-face-to-face time during this encounter.   Tandy Gaw, PA-C

## 2023-03-02 NOTE — Progress Notes (Signed)
Left shoulder pain onset for 3 weeks pt states that the pain is sharp, intermitting pain there bone pops back in the socket .   Pt prefers a phone call

## 2023-03-04 ENCOUNTER — Ambulatory Visit (INDEPENDENT_AMBULATORY_CARE_PROVIDER_SITE_OTHER): Payer: Medicare Other

## 2023-03-04 DIAGNOSIS — M25512 Pain in left shoulder: Secondary | ICD-10-CM | POA: Diagnosis not present

## 2023-03-07 ENCOUNTER — Encounter: Payer: Self-pay | Admitting: Physician Assistant

## 2023-03-07 DIAGNOSIS — M19012 Primary osteoarthritis, left shoulder: Secondary | ICD-10-CM | POA: Insufficient documentation

## 2023-03-07 NOTE — Progress Notes (Signed)
You have a lot of reason for pain in the left shoulder. You have arthritis and narrowing. You need to see Dr. Karie Schwalbe for injection here in office, he is a sports medicine specialist and you would benefit for physical therapy. Are you ok with me ordering PT for here in the building?

## 2023-03-08 ENCOUNTER — Encounter: Payer: Self-pay | Admitting: Physician Assistant

## 2023-03-08 DIAGNOSIS — M19012 Primary osteoarthritis, left shoulder: Secondary | ICD-10-CM

## 2023-03-11 ENCOUNTER — Ambulatory Visit (INDEPENDENT_AMBULATORY_CARE_PROVIDER_SITE_OTHER): Payer: Medicare Other | Admitting: Sports Medicine

## 2023-03-11 DIAGNOSIS — M19012 Primary osteoarthritis, left shoulder: Secondary | ICD-10-CM

## 2023-03-11 MED ORDER — ACETAMINOPHEN ER 650 MG PO TBCR: 650.0000 mg | EXTENDED_RELEASE_TABLET | Freq: Three times a day (TID) | ORAL | 3 refills | Status: AC | PRN

## 2023-03-11 MED ORDER — MELOXICAM 15 MG PO TABS: ORAL_TABLET | ORAL | 3 refills | Status: DC

## 2023-03-11 NOTE — Assessment & Plan Note (Signed)
Very pleasant 82 year old female, chronic left shoulder pain, glenohumeral signs on exam, x-rays confirming osteoarthritis, she is scheduled for physical therapy sometime next week. Suspect glenohumeral osteoarthritis is the pain generator, adding arthritis strength Tylenol, meloxicam for breakthrough pain, continue therapy, will do an injection in 6 weeks if not better.

## 2023-03-11 NOTE — Progress Notes (Signed)
    Procedures performed today:    None.  Independent interpretation of notes and tests performed by another provider:   None.  Brief History, Exam, Impression, and Recommendations:    Primary osteoarthritis of left shoulder Very pleasant 82 year old female, chronic left shoulder pain, glenohumeral signs on exam, x-rays confirming osteoarthritis, she is scheduled for physical therapy sometime next week. Suspect glenohumeral osteoarthritis is the pain generator, adding arthritis strength Tylenol, meloxicam for breakthrough pain, continue therapy, will do an injection in 6 weeks if not better.    ____________________________________________ Ihor Austin. Benjamin Stain, M.D., ABFM., CAQSM., AME. Primary Care and Sports Medicine [facility]  Adjunct Professor of Family Medicine  [employer]  Restaurant manager, fast food

## 2023-03-18 ENCOUNTER — Ambulatory Visit: Payer: Medicare Other | Attending: Physician Assistant | Admitting: Physical Therapy

## 2023-03-18 ENCOUNTER — Encounter: Payer: Self-pay | Admitting: Physical Therapy

## 2023-03-18 ENCOUNTER — Other Ambulatory Visit: Payer: Self-pay

## 2023-03-18 DIAGNOSIS — M19012 Primary osteoarthritis, left shoulder: Secondary | ICD-10-CM | POA: Insufficient documentation

## 2023-03-18 DIAGNOSIS — R293 Abnormal posture: Secondary | ICD-10-CM | POA: Insufficient documentation

## 2023-03-18 DIAGNOSIS — M6281 Muscle weakness (generalized): Secondary | ICD-10-CM | POA: Insufficient documentation

## 2023-03-18 DIAGNOSIS — M25512 Pain in left shoulder: Secondary | ICD-10-CM | POA: Diagnosis present

## 2023-03-18 NOTE — Therapy (Signed)
OUTPATIENT PHYSICAL THERAPY SHOULDER EVALUATION   Patient Name: Shirley James MRN: [id number] DOB:Sep 08, 1941, 82 y.o., female Today's Date: 03/18/2023  END OF SESSION:  PT End of Session - 03/18/23 1010     Visit Number 1    Number of Visits 16    Date for PT Re-Evaluation 05/13/23    Authorization Type UHC Medicare    Progress Note Due on Visit 10    PT Start Time 1011    PT Stop Time 1055    PT Time Calculation (min) 44 min    Activity Tolerance Patient tolerated treatment well    Behavior During Therapy WFL for tasks assessed/performed             Past Medical History:  Diagnosis Date   Hypertension    Past Surgical History:  Procedure Laterality Date   ABDOMINAL HYSTERECTOMY     LAPAROSCOPIC BILATERAL SALPINGO OOPHERECTOMY Bilateral 2005   TONSILECTOMY, ADENOIDECTOMY, BILATERAL MYRINGOTOMY AND TUBES     Patient Active Problem List   Diagnosis Date Noted   Primary osteoarthritis of left shoulder 03/07/2023   Acute pain of left shoulder 03/02/2023   Mild episode of recurrent major depressive disorder (HCC) 10/08/2022   Irritability 10/08/2022   Carotid atherosclerosis, bilateral 07/08/2021   Low serum vitamin B12 06/23/2021   Elevated LDL cholesterol level 06/23/2021   Amnestic MCI (mild cognitive impairment with memory loss) 12/29/2020   Osteopenia 12/22/2018   Thyroid nodule 12/13/2018   Right carotid bruit 12/04/2018   Bilateral carpal tunnel syndrome 12/19/2017   Tinnitus of both ears 12/19/2017   Paresthesia of both hands 07/11/2017   LUQ pain 03/16/2016   B12 deficiency 03/23/2015   Vitamin D deficiency 03/23/2015   Other fatigue 03/23/2015   Heart murmur, systolic 05/13/2014   PND (post-nasal drip) 11/07/2013   Essential hypertension, benign 08/03/2013   Insomnia 08/03/2013    PCP: Jomarie Longs, PA-C  REFERRING PROVIDER: Jomarie Longs, PA-C  REFERRING DIAG: [phone] (ICD-10-CM) - Localized osteoarthritis of left shoulder  THERAPY  DIAG:  Abnormal posture  Acute pain of left shoulder  Muscle weakness (generalized)  Rationale for Evaluation and Treatment: Rehabilitation  ONSET DATE: ~2 months  SUBJECTIVE:                                                                                                                                                                                      SUBJECTIVE STATEMENT: Pt states she is not sure where it came from but her L shoulder started popping out of socket. Pt is not sure what movements cause it. No history of shoulder injuries.  Hand dominance: Right  PERTINENT HISTORY: None  PAIN:  Are you having pain? Yes: NPRS scale: 0 currently, 10 at most/10 Pain location: Feels L shoulder pops forward Pain description: sharp and sudden Aggravating factors: "out of the blue" Relieving factors: has not had to use  PRECAUTIONS: None  WEIGHT BEARING RESTRICTIONS: No  FALLS:  Has patient fallen in last 6 months? No  LIVING ENVIRONMENT: Lives with: lives with their spouse Lives in: House/apartment Stairs: No Has following equipment at home: None  OCCUPATION: Retired Char[employer] fundraiser  PLOF: Independent  PATIENT GOALS: No pain with all mobility  NEXT MD VISIT:   OBJECTIVE:   DIAGNOSTIC FINDINGS:  L shoulder x-ray 03/04/23 IMPRESSION: 1. Moderate glenohumeral and mild acromioclavicular osteoarthritis. 2. Moderate to high-grade lateral downsloping of the acromion with high-grade distal lateral subacromial spurs that nearly contacts the distal lateral humeral head. Acromial and adjacent humeral head bone hypertrophy likely from chronic subacromial impingement.  PATIENT SURVEYS:  Quick DASH: 6.8%  COGNITION: Overall cognitive status: Within functional limits for tasks assessed     SENSATION: WFL  POSTURE: Scapulae abducted, forward head, rounded shoulders  UPPER EXTREMITY ROM:   Active ROM Right eval Left eval  Shoulder flexion 120 120 *some pain bring arm down   Shoulder extension 60 55*  Shoulder abduction 125 120  Shoulder adduction    Shoulder internal rotation T12 T12  Shoulder external rotation 45 35  Elbow flexion    Elbow extension    Wrist flexion    Wrist extension    Wrist ulnar deviation    Wrist radial deviation    Wrist pronation    Wrist supination    (Blank rows = not tested) * = concordant pain  UPPER EXTREMITY MMT:  MMT Right eval Left eval  Shoulder flexion 5 3+  Shoulder extension 5 3+*  Shoulder abduction 5 3+  Shoulder adduction    Shoulder internal rotation 5 4  Shoulder external rotation 5 3+  Middle trapezius    Lower trapezius    Elbow flexion    Elbow extension    Wrist flexion    Wrist extension    Wrist ulnar deviation    Wrist radial deviation    Wrist pronation    Wrist supination    Grip strength (lbs)    (Blank rows = not tested) * = concordant pain  SHOULDER SPECIAL TESTS: Impingement tests: Painful arc test: negative SLAP lesions: Biceps load test: negative Instability tests: Apprehension test: positive  and Posterior drawer test: positive  Rotator cuff assessment: External rotation lag sign: negative, Internal rotation lag sign: negative, and Infraspinatus test: negative Biceps assessment: Speed's test: negative  JOINT MOBILITY TESTING:  Hypermobile with anterior GH mobs  PALPATION:  mild L UT and infraspinatus insertion tenderness   TODAY'S TREATMENT:  DATE: 03/18/23 See HEP below   PATIENT EDUCATION: Education details: Exam findings, POC, initial HEP Person educated: Patient and Child(ren) Education method: Explanation, Demonstration, and Handouts Education comprehension: verbalized understanding, returned demonstration, and needs further education  HOME EXERCISE PROGRAM: Access Code: [email or web address] URL:  [email or web address] Date: 03/18/2023 Prepared by: Vernon Prey April Kirstie Peri  Exercises - Seated Scapular Retraction  - 1 x daily - 7 x weekly - 2 sets - 10 reps - Standing Bilateral Low Shoulder Row with Anchored Resistance  - 1 x daily - 7 x weekly - 2 sets - 10 reps - Shoulder External Rotation and Scapular Retraction with Resistance  - 1 x daily - 7 x weekly - 2 sets - 10 reps - 3 sec hold - Shoulder W - External Rotation with Resistance  - 1 x daily - 7 x weekly - 2 sets - 10 reps - 3 sec hold - Standing Shoulder Horizontal Abduction with Resistance  - 1 x daily - 7 x weekly - 2 sets - 10 reps - 3 sec hold  ASSESSMENT:  CLINICAL IMPRESSION: Patient is a 82 y.o. F who was seen today for physical therapy evaluation and treatment for L shoulder instability. Assessment significant for general rotator cuff and posterior shoulder weakness (L worse than R) leading to increased pain particularly with shoulder extension. No overt difference with shoulder ROM between L & R. Special testing demos s/s consistent with anterior shoulder instability. Pt would benefit from PT to address these issues for return to full function.   OBJECTIVE IMPAIRMENTS: decreased activity tolerance, decreased mobility, decreased strength, increased fascial restrictions, impaired UE functional use, improper body mechanics, postural dysfunction, and pain.   ACTIVITY LIMITATIONS: carrying, lifting, dressing, and hygiene/grooming  PARTICIPATION LIMITATIONS: meal prep, cleaning, laundry, and yard work  PERSONAL FACTORS: Age, Past/current experiences, and Time since onset of injury/illness/exacerbation are also affecting patient's functional outcome.   REHAB POTENTIAL: Good  CLINICAL DECISION MAKING: Stable/uncomplicated  EVALUATION COMPLEXITY: Low   GOALS: Goals reviewed with patient? Yes  SHORT TERM GOALS: Target date: 04/15/2023   Pt will be ind with initial HEP Baseline: Goal status:  INITIAL  2.  Pt will be able to perform shoulder ext against resistance without any pain or discomfort Baseline:  Goal status: INITIAL    LONG TERM GOALS: Target date: 05/13/2023  Pt will be ind with management and progression of symptoms Baseline:  Goal status: INITIAL  2.  Pt will report no instances of pain with all shoulder motion Baseline:  Goal status: INITIAL  3.  Pt will have 0% on quick DASH Baseline:  Goal status: INITIAL   PLAN:  PT FREQUENCY: 2x/week  PT DURATION: 8 weeks  PLANNED INTERVENTIONS: Therapeutic exercises, Therapeutic activity, Neuromuscular re-education, Balance training, Gait training, Patient/Family education, Self Care, Joint mobilization, Aquatic Therapy, Dry Needling, Electrical stimulation, Cryotherapy, Moist heat, Taping, Vasopneumatic device, Ionotophoresis 4mg /ml Dexamethasone, Manual therapy, and Re-evaluation  PLAN FOR NEXT SESSION: Assess response to HEP. Continue gross rotator cuff and posterior scap strengthening.    Vincentina Sollers April Ma L Jotham Ahn, PT 03/18/2023, 11:49 AM

## 2023-03-23 ENCOUNTER — Ambulatory Visit: Payer: Medicare Other | Attending: Physician Assistant | Admitting: Physical Therapy

## 2023-03-23 ENCOUNTER — Encounter: Payer: Self-pay | Admitting: Physical Therapy

## 2023-03-23 DIAGNOSIS — M6281 Muscle weakness (generalized): Secondary | ICD-10-CM | POA: Diagnosis present

## 2023-03-23 DIAGNOSIS — M25512 Pain in left shoulder: Secondary | ICD-10-CM | POA: Insufficient documentation

## 2023-03-23 DIAGNOSIS — R293 Abnormal posture: Secondary | ICD-10-CM

## 2023-03-23 NOTE — Therapy (Signed)
OUTPATIENT PHYSICAL THERAPY SHOULDER TREATMENT   Patient Name: Shirley James MRN: [id number] DOB:1941-09-01, 82 y.o., female Today's Date: 03/23/2023  END OF SESSION:  PT End of Session - 03/23/23 1023     Visit Number 2    Number of Visits 16    Date for PT Re-Evaluation 05/13/23    Authorization Type UHC Medicare    Progress Note Due on Visit 10    PT Start Time 1023   late arrival   PT Stop Time 1100    PT Time Calculation (min) 37 min    Activity Tolerance Patient tolerated treatment well    Behavior During Therapy WFL for tasks assessed/performed              Past Medical History:  Diagnosis Date   Hypertension    Past Surgical History:  Procedure Laterality Date   ABDOMINAL HYSTERECTOMY     LAPAROSCOPIC BILATERAL SALPINGO OOPHERECTOMY Bilateral 2005   TONSILECTOMY, ADENOIDECTOMY, BILATERAL MYRINGOTOMY AND TUBES     Patient Active Problem List   Diagnosis Date Noted   Primary osteoarthritis of left shoulder 03/07/2023   Acute pain of left shoulder 03/02/2023   Mild episode of recurrent major depressive disorder (HCC) 10/08/2022   Irritability 10/08/2022   Carotid atherosclerosis, bilateral 07/08/2021   Low serum vitamin B12 06/23/2021   Elevated LDL cholesterol level 06/23/2021   Amnestic MCI (mild cognitive impairment with memory loss) 12/29/2020   Osteopenia 12/22/2018   Thyroid nodule 12/13/2018   Right carotid bruit 12/04/2018   Bilateral carpal tunnel syndrome 12/19/2017   Tinnitus of both ears 12/19/2017   Paresthesia of both hands 07/11/2017   LUQ pain 03/16/2016   B12 deficiency 03/23/2015   Vitamin D deficiency 03/23/2015   Other fatigue 03/23/2015   Heart murmur, systolic 05/13/2014   PND (post-nasal drip) 11/07/2013   Essential hypertension, benign 08/03/2013   Insomnia 08/03/2013    PCP: Jomarie Longs, PA-C  REFERRING PROVIDER: Jomarie Longs, PA-C  REFERRING DIAG: [phone] (ICD-10-CM) - Localized osteoarthritis of left  shoulder  THERAPY DIAG:  Abnormal posture  Acute pain of left shoulder  Muscle weakness (generalized)  Rationale for Evaluation and Treatment: Rehabilitation  ONSET DATE: ~2 months  SUBJECTIVE:                                                                                                                                                                                      SUBJECTIVE STATEMENT: Pt states she has done exercises once or twice since eval but forgot. Notes no instances  Hand dominance: Right  PERTINENT HISTORY: None  PAIN:  Are you having pain? Yes: NPRS scale: 0 currently,  10 at most/10 Pain location: Feels L shoulder pops forward Pain description: sharp and sudden Aggravating factors: "out of the blue" Relieving factors: has not had to use  PRECAUTIONS: None  WEIGHT BEARING RESTRICTIONS: No  FALLS:  Has patient fallen in last 6 months? No  LIVING ENVIRONMENT: Lives with: lives with their spouse Lives in: House/apartment Stairs: No Has following equipment at home: None  OCCUPATION: Retired Char[employer] fundraiser  PLOF: Independent  PATIENT GOALS: No pain with all mobility  NEXT MD VISIT:   OBJECTIVE:   DIAGNOSTIC FINDINGS:  L shoulder x-ray 03/04/23 IMPRESSION: 1. Moderate glenohumeral and mild acromioclavicular osteoarthritis. 2. Moderate to high-grade lateral downsloping of the acromion with high-grade distal lateral subacromial spurs that nearly contacts the distal lateral humeral head. Acromial and adjacent humeral head bone hypertrophy likely from chronic subacromial impingement.  PATIENT SURVEYS:  Quick DASH: 6.8%  COGNITION: Overall cognitive status: Within functional limits for tasks assessed     SENSATION: WFL  POSTURE: Scapulae abducted, forward head, rounded shoulders  UPPER EXTREMITY ROM:   Active ROM Right eval Left eval  Shoulder flexion 120 120 *some pain bring arm down  Shoulder extension 60 55*  Shoulder abduction 125 120   Shoulder adduction    Shoulder internal rotation T12 T12  Shoulder external rotation 45 35  Elbow flexion    Elbow extension    Wrist flexion    Wrist extension    Wrist ulnar deviation    Wrist radial deviation    Wrist pronation    Wrist supination    (Blank rows = not tested) * = concordant pain  UPPER EXTREMITY MMT:  MMT Right eval Left eval  Shoulder flexion 5 3+  Shoulder extension 5 3+*  Shoulder abduction 5 3+  Shoulder adduction    Shoulder internal rotation 5 4  Shoulder external rotation 5 3+  Middle trapezius    Lower trapezius    Elbow flexion    Elbow extension    Wrist flexion    Wrist extension    Wrist ulnar deviation    Wrist radial deviation    Wrist pronation    Wrist supination    Grip strength (lbs)    (Blank rows = not tested) * = concordant pain  SHOULDER SPECIAL TESTS: Impingement tests: Painful arc test: negative SLAP lesions: Biceps load test: negative Instability tests: Apprehension test: positive  and Posterior drawer test: positive  Rotator cuff assessment: External rotation lag sign: negative, Internal rotation lag sign: negative, and Infraspinatus test: negative Biceps assessment: Speed's test: negative  JOINT MOBILITY TESTING:  Hypermobile with anterior GH mobs  PALPATION:  mild L UT and infraspinatus insertion tenderness   TODAY'S TREATMENT:       [facility] Adult PT Treatment:                                                DATE: 03/23/23 Therapeutic Exercise: Pulleys flexion, scaption, horizontal abd x1 min each Seated scapular retraction 2x10x3 sec Cervical retraction 2x10x3 sec Shoulder ER red against pool noodle TB 2x10 "W" red TB against pool noodle 2x10 Shoulder horizontal abd red TB against pool noodle 2x10 Row red TB 2x10 Shoulder ext red TB 2x10 Doorway pec stretch 60 and 90 deg 2x30 sec each Shoulder flexion red TB 2x10 Shoulder abduction red TB 2x10  DATE: 03/18/23 See HEP below   PATIENT EDUCATION: Education details: Exam findings, POC, initial HEP Person educated: Patient and Child(ren) Education method: Explanation, Demonstration, and Handouts Education comprehension: verbalized understanding, returned demonstration, and needs further education  HOME EXERCISE PROGRAM: Access Code: [email or web address] URL: [email or web address] Date: 03/18/2023 Prepared by: Vernon Prey April Kirstie Peri  Exercises - Seated Scapular Retraction  - 1 x daily - 7 x weekly - 2 sets - 10 reps - Standing Bilateral Low Shoulder Row with Anchored Resistance  - 1 x daily - 7 x weekly - 2 sets - 10 reps - Shoulder External Rotation and Scapular Retraction with Resistance  - 1 x daily - 7 x weekly - 2 sets - 10 reps - 3 sec hold - Shoulder W - External Rotation with Resistance  - 1 x daily - 7 x weekly - 2 sets - 10 reps - 3 sec hold - Standing Shoulder Horizontal Abduction with Resistance  - 1 x daily - 7 x weekly - 2 sets - 10 reps - 3 sec hold  ASSESSMENT:  CLINICAL IMPRESSION: Reviewed HEP. Continued to work on periscapular and RTC strengthening. Decreased shoulder ext pain this session with cues for scapular stabilization.  From eval: Patient is a 82 y.o. F who was seen today for physical therapy evaluation and treatment for L shoulder instability. Assessment significant for general rotator cuff and posterior shoulder weakness (L worse than R) leading to increased pain particularly with shoulder extension. No overt difference with shoulder ROM between L & R. Special testing demos s/s consistent with anterior shoulder instability. Pt would benefit from PT to address these issues for return to full function.   OBJECTIVE IMPAIRMENTS: decreased activity tolerance, decreased mobility, decreased strength, increased fascial restrictions, impaired UE functional use, improper body mechanics,  postural dysfunction, and pain.    GOALS: Goals reviewed with patient? Yes  SHORT TERM GOALS: Target date: 04/15/2023   Pt will be ind with initial HEP Baseline: Goal status: INITIAL  2.  Pt will be able to perform shoulder ext against resistance without any pain or discomfort Baseline:  Goal status: INITIAL    LONG TERM GOALS: Target date: 05/13/2023  Pt will be ind with management and progression of symptoms Baseline:  Goal status: INITIAL  2.  Pt will report no instances of pain with all shoulder motion Baseline:  Goal status: INITIAL  3.  Pt will have 0% on quick DASH Baseline:  Goal status: INITIAL   PLAN:  PT FREQUENCY: 2x/week  PT DURATION: 8 weeks  PLANNED INTERVENTIONS: Therapeutic exercises, Therapeutic activity, Neuromuscular re-education, Balance training, Gait training, Patient/Family education, Self Care, Joint mobilization, Aquatic Therapy, Dry Needling, Electrical stimulation, Cryotherapy, Moist heat, Taping, Vasopneumatic device, Ionotophoresis 4mg /ml Dexamethasone, Manual therapy, and Re-evaluation  PLAN FOR NEXT SESSION: Assess response to HEP. Continue gross rotator cuff and posterior scap strengthening.    Makenah Karas April Ma L Vy Badley, PT 03/23/2023, 10:23 AM

## 2023-03-25 ENCOUNTER — Ambulatory Visit: Payer: Medicare Other

## 2023-03-25 DIAGNOSIS — M6281 Muscle weakness (generalized): Secondary | ICD-10-CM

## 2023-03-25 DIAGNOSIS — R293 Abnormal posture: Secondary | ICD-10-CM | POA: Diagnosis not present

## 2023-03-25 DIAGNOSIS — M25512 Pain in left shoulder: Secondary | ICD-10-CM

## 2023-03-25 NOTE — Therapy (Signed)
OUTPATIENT PHYSICAL THERAPY SHOULDER TREATMENT   Patient Name: Shirley James MRN: [id number] DOB:Oct 28, 1941, 82 y.o., female Today's Date: 03/25/2023  END OF SESSION:  PT End of Session - 03/25/23 0832     Visit Number 3    Number of Visits 16    Date for PT Re-Evaluation 05/13/23    Authorization Type [employer] Medicare    Progress Note Due on Visit 10    PT Start Time 0832    PT Stop Time 0910    PT Time Calculation (min) 38 min    Activity Tolerance Patient tolerated treatment well    Behavior During Therapy [facility] for tasks assessed/performed              Past Medical History:  Diagnosis Date   Hypertension    Past Surgical History:  Procedure Laterality Date   ABDOMINAL HYSTERECTOMY     LAPAROSCOPIC BILATERAL SALPINGO OOPHERECTOMY Bilateral 2005   TONSILECTOMY, ADENOIDECTOMY, BILATERAL MYRINGOTOMY AND TUBES     Patient Active Problem List   Diagnosis Date Noted   Primary osteoarthritis of left shoulder 03/07/2023   Acute pain of left shoulder 03/02/2023   Mild episode of recurrent major depressive disorder (HCC) 10/08/2022   Irritability 10/08/2022   Carotid atherosclerosis, bilateral 07/08/2021   Low serum vitamin B12 06/23/2021   Elevated LDL cholesterol level 06/23/2021   Amnestic MCI (mild cognitive impairment with memory loss) 12/29/2020   Osteopenia 12/22/2018   Thyroid nodule 12/13/2018   Right carotid bruit 12/04/2018   Bilateral carpal tunnel syndrome 12/19/2017   Tinnitus of both ears 12/19/2017   Paresthesia of both hands 07/11/2017   LUQ pain 03/16/2016   B12 deficiency 03/23/2015   Vitamin D deficiency 03/23/2015   Other fatigue 03/23/2015   Heart murmur, systolic 05/13/2014   PND (post-nasal drip) 11/07/2013   Essential hypertension, benign 08/03/2013   Insomnia 08/03/2013    PCP: Jomarie Longs, PA-C  REFERRING PROVIDER: Jomarie Longs, PA-C  REFERRING DIAG: [phone] (ICD-10-CM) - Localized osteoarthritis of left shoulder  THERAPY  DIAG:  Abnormal posture  Acute pain of left shoulder  Muscle weakness (generalized)  Rationale for Evaluation and Treatment: Rehabilitation  ONSET DATE: ~2 months  SUBJECTIVE:                                                                                                                                                                                      SUBJECTIVE STATEMENT: Patient reports no pain in shoulder today, states she is compliant with HEP and has no issues.  Hand dominance: Right  PERTINENT HISTORY: None  PAIN:  Are you having pain? Yes: NPRS scale: 0 currently, 10 at  most/10 Pain location: Feels L shoulder pops forward Pain description: sharp and sudden Aggravating factors: "out of the blue" Relieving factors: has not had to use  PRECAUTIONS: None  WEIGHT BEARING RESTRICTIONS: No  FALLS:  Has patient fallen in last 6 months? No  LIVING ENVIRONMENT: Lives with: lives with their spouse Lives in: House/apartment Stairs: No Has following equipment at home: None  OCCUPATION: Retired [employer] fundraiser  PLOF: Independent  PATIENT GOALS: No pain with all mobility  NEXT MD VISIT:   OBJECTIVE:   DIAGNOSTIC FINDINGS:  L shoulder x-ray 03/04/23 IMPRESSION: 1. Moderate glenohumeral and mild acromioclavicular osteoarthritis. 2. Moderate to high-grade lateral downsloping of the acromion with high-grade distal lateral subacromial spurs that nearly contacts the distal lateral humeral head. Acromial and adjacent humeral head bone hypertrophy likely from chronic subacromial impingement.  PATIENT SURVEYS:  Quick DASH: 6.8%  COGNITION: Overall cognitive status: Within functional limits for tasks assessed     SENSATION: WFL  POSTURE: Scapulae abducted, forward head, rounded shoulders  UPPER EXTREMITY ROM:   Active ROM Right eval Left eval  Shoulder flexion 120 120 *some pain bring arm down  Shoulder extension 60 55*  Shoulder abduction 125 120  Shoulder adduction     Shoulder internal rotation T12 T12  Shoulder external rotation 45 35  Elbow flexion    Elbow extension    Wrist flexion    Wrist extension    Wrist ulnar deviation    Wrist radial deviation    Wrist pronation    Wrist supination    (Blank rows = not tested) * = concordant pain  UPPER EXTREMITY MMT:  MMT Right eval Left eval  Shoulder flexion 5 3+  Shoulder extension 5 3+*  Shoulder abduction 5 3+  Shoulder adduction    Shoulder internal rotation 5 4  Shoulder external rotation 5 3+  Middle trapezius    Lower trapezius    Elbow flexion    Elbow extension    Wrist flexion    Wrist extension    Wrist ulnar deviation    Wrist radial deviation    Wrist pronation    Wrist supination    Grip strength (lbs)    (Blank rows = not tested) * = concordant pain  SHOULDER SPECIAL TESTS: Impingement tests: Painful arc test: negative SLAP lesions: Biceps load test: negative Instability tests: Apprehension test: positive  and Posterior drawer test: positive  Rotator cuff assessment: External rotation lag sign: negative, Internal rotation lag sign: negative, and Infraspinatus test: negative Biceps assessment: Speed's test: negative  JOINT MOBILITY TESTING:  Hypermobile with anterior GH mobs  PALPATION:  mild L UT and infraspinatus insertion tenderness   TODAY'S TREATMENT:        [facility] Adult PT Treatment:                                                DATE: 03/25/2023 Therapeutic Exercise: Shoulder pulleys: flexion, scaption, abduction x 1 min each (with noodle) Seated scap squeezes with noodle 10x5" Seated cervical retraction with noodle (L finger) 10x3" Standing: No monies RTB --> YTB x10 Straight arm shoulder horizontal abd RTB x10 L shoulder ER RTB 2x10 "W" RTB x10 Rows RTB x15 SA wall slides Shoulder extension RTB x10 Shoulder ER isometric step out RTB x10 Doorway pec stretch 3x30" UBE L2 x fwd/45min bkwd   [facility] Adult PT Treatment:  DATE: 03/23/23 Therapeutic Exercise: Pulleys flexion, scaption, horizontal abd x1 min each Seated scapular retraction 2x10x3 sec Cervical retraction 2x10x3 sec Shoulder ER red against pool noodle TB 2x10 "W" red TB against pool noodle 2x10 Shoulder horizontal abd red TB against pool noodle 2x10 Row red TB 2x10 Shoulder ext red TB 2x10 Doorway pec stretch 60 and 90 deg 2x30 sec each Shoulder flexion red TB 2x10 Shoulder abduction red TB 2x10                                                                                                                                      PATIENT EDUCATION: Education details: Updated HEP Person educated: Patient and Child(ren) Education method: Explanation, Demonstration, and Handouts Education comprehension: verbalized understanding, returned demonstration, and needs further education  HOME EXERCISE PROGRAM: Access Code: [email or web address] URL: [email or web address] Date: 03/25/2023 Prepared by: Carlynn Herald  Exercises - Seated Scapular Retraction  - 1 x daily - 7 x weekly - 2 sets - 10 reps - 3 sec hold - Standing Bilateral Low Shoulder Row with Anchored Resistance  - 1 x daily - 7 x weekly - 2 sets - 10 reps - Shoulder External Rotation and Scapular Retraction with Resistance  - 1 x daily - 7 x weekly - 2 sets - 10 reps - 3 sec hold - Shoulder W - External Rotation with Resistance  - 1 x daily - 7 x weekly - 2 sets - 10 reps - 3 sec hold - Standing Shoulder Horizontal Abduction with Resistance  - 1 x daily - 7 x weekly - 2 sets - 10 reps - 3 sec hold - Standing Cervical Retraction  - 1 x daily - 7 x weekly - 2 sets - 10 reps - 3 sec hold - Doorway Pec Stretch at 90 Degrees Abduction  - 1 x daily - 7 x weekly - 3 sets - 10 reps - Shoulder External Rotation with Anchored Resistance  - 1 x daily - 7 x weekly - 3 sets - 10 reps  ASSESSMENT:  CLINICAL IMPRESSION:  Noted upper trapezius compensation during periscapular  strengthening exercises; cueing improved postural awareness. UBE introduced at end of session to progress functional shoulder strengthen and mobility; patient able to complete with no adverse affects.   From eval: Patient is a 82 y.o. F who was seen today for physical therapy evaluation and treatment for L shoulder instability. Assessment significant for general rotator cuff and posterior shoulder weakness (L worse than R) leading to increased pain particularly with shoulder extension. No overt difference with shoulder ROM between L & R. Special testing demos s/s consistent with anterior shoulder instability. Pt would benefit from PT to address these issues for return to full function.   OBJECTIVE IMPAIRMENTS: decreased activity tolerance, decreased mobility, decreased strength, increased fascial restrictions, impaired UE functional use, improper body mechanics, postural dysfunction, and pain.    GOALS: Goals reviewed with patient? Yes  SHORT TERM GOALS: Target date: 04/15/2023   Pt will be ind with initial HEP Baseline: Goal status: INITIAL  2.  Pt will be able to perform shoulder ext against resistance without any pain or discomfort Baseline:  Goal status: INITIAL    LONG TERM GOALS: Target date: 05/13/2023  Pt will be ind with management and progression of symptoms Baseline:  Goal status: INITIAL  2.  Pt will report no instances of pain with all shoulder motion Baseline:  Goal status: INITIAL  3.  Pt will have 0% on quick DASH Baseline:  Goal status: INITIAL   PLAN:  PT FREQUENCY: 2x/week  PT DURATION: 8 weeks  PLANNED INTERVENTIONS: Therapeutic exercises, Therapeutic activity, Neuromuscular re-education, Balance training, Gait training, Patient/Family education, Self Care, Joint mobilization, Aquatic Therapy, Dry Needling, Electrical stimulation, Cryotherapy, Moist heat, Taping, Vasopneumatic device, Ionotophoresis 4mg /ml Dexamethasone, Manual therapy, and  Re-evaluation  PLAN FOR NEXT SESSION: Continue gross rotator cuff and posterior scap strengthening.    Sanjuana Mae, PTA 03/25/2023, 9:10 AM

## 2023-03-29 ENCOUNTER — Encounter: Payer: Self-pay | Admitting: Physical Therapy

## 2023-03-29 ENCOUNTER — Ambulatory Visit: Payer: Medicare Other | Admitting: Physical Therapy

## 2023-03-29 DIAGNOSIS — R293 Abnormal posture: Secondary | ICD-10-CM | POA: Diagnosis not present

## 2023-03-29 DIAGNOSIS — M6281 Muscle weakness (generalized): Secondary | ICD-10-CM

## 2023-03-29 DIAGNOSIS — M25512 Pain in left shoulder: Secondary | ICD-10-CM

## 2023-03-29 NOTE — Therapy (Signed)
OUTPATIENT PHYSICAL THERAPY SHOULDER TREATMENT   Patient Name: Shirley James MRN: [id number] DOB:May 14, 1941, 82 y.o., female Today's Date: 03/29/2023  END OF SESSION:  PT End of Session - 03/29/23 0931     Visit Number 4    Number of Visits 16    Date for PT Re-Evaluation 05/13/23    Authorization Type [employer] Medicare    Progress Note Due on Visit 10    PT Start Time 0931    PT Stop Time 1015    PT Time Calculation (min) 44 min    Activity Tolerance Patient tolerated treatment well    Behavior During Therapy WFL for tasks assessed/performed               Past Medical History:  Diagnosis Date   Hypertension    Past Surgical History:  Procedure Laterality Date   ABDOMINAL HYSTERECTOMY     LAPAROSCOPIC BILATERAL SALPINGO OOPHERECTOMY Bilateral 2005   TONSILECTOMY, ADENOIDECTOMY, BILATERAL MYRINGOTOMY AND TUBES     Patient Active Problem List   Diagnosis Date Noted   Primary osteoarthritis of left shoulder 03/07/2023   Acute pain of left shoulder 03/02/2023   Mild episode of recurrent major depressive disorder (HCC) 10/08/2022   Irritability 10/08/2022   Carotid atherosclerosis, bilateral 07/08/2021   Low serum vitamin B12 06/23/2021   Elevated LDL cholesterol level 06/23/2021   Amnestic MCI (mild cognitive impairment with memory loss) 12/29/2020   Osteopenia 12/22/2018   Thyroid nodule 12/13/2018   Right carotid bruit 12/04/2018   Bilateral carpal tunnel syndrome 12/19/2017   Tinnitus of both ears 12/19/2017   Paresthesia of both hands 07/11/2017   LUQ pain 03/16/2016   B12 deficiency 03/23/2015   Vitamin D deficiency 03/23/2015   Other fatigue 03/23/2015   Heart murmur, systolic 05/13/2014   PND (post-nasal drip) 11/07/2013   Essential hypertension, benign 08/03/2013   Insomnia 08/03/2013    PCP: Jomarie Longs, PA-C  REFERRING PROVIDER: Jomarie Longs, PA-C  REFERRING DIAG: [phone] (ICD-10-CM) - Localized osteoarthritis of left  shoulder  THERAPY DIAG:  Abnormal posture  Acute pain of left shoulder  Muscle weakness (generalized)  Rationale for Evaluation and Treatment: Rehabilitation  ONSET DATE: ~2 months  SUBJECTIVE:                                                                                                                                                                                      SUBJECTIVE STATEMENT: Patient states there were a couple times her shoulder seemed to want to pop but didn't. Hand dominance: Right  PERTINENT HISTORY: None  PAIN:  Are you having pain? Yes: NPRS scale: 0 currently, 10 at most/10  Pain location: Feels L shoulder pops forward Pain description: sharp and sudden Aggravating factors: "out of the blue" Relieving factors: has not had to use  PRECAUTIONS: None  WEIGHT BEARING RESTRICTIONS: No  FALLS:  Has patient fallen in last 6 months? No  LIVING ENVIRONMENT: Lives with: lives with their spouse Lives in: House/apartment Stairs: No Has following equipment at home: None  OCCUPATION: Retired [employer] fundraiser  PLOF: Independent  PATIENT GOALS: No pain with all mobility  NEXT MD VISIT:   OBJECTIVE:   DIAGNOSTIC FINDINGS:  L shoulder x-ray 03/04/23 IMPRESSION: 1. Moderate glenohumeral and mild acromioclavicular osteoarthritis. 2. Moderate to high-grade lateral downsloping of the acromion with high-grade distal lateral subacromial spurs that nearly contacts the distal lateral humeral head. Acromial and adjacent humeral head bone hypertrophy likely from chronic subacromial impingement.  PATIENT SURVEYS:  Quick DASH: 6.8%  POSTURE: Scapulae abducted, forward head, rounded shoulders  UPPER EXTREMITY ROM:   Active ROM Right eval Left eval  Shoulder flexion 120 120 *some pain bring arm down  Shoulder extension 60 55*  Shoulder abduction 125 120  Shoulder adduction    Shoulder internal rotation T12 T12  Shoulder external rotation 45 35  Elbow flexion     Elbow extension    Wrist flexion    Wrist extension    Wrist ulnar deviation    Wrist radial deviation    Wrist pronation    Wrist supination    (Blank rows = not tested) * = concordant pain  UPPER EXTREMITY MMT:  MMT Right eval Left eval  Shoulder flexion 5 3+  Shoulder extension 5 3+*  Shoulder abduction 5 3+  Shoulder adduction    Shoulder internal rotation 5 4  Shoulder external rotation 5 3+  Middle trapezius    Lower trapezius    Elbow flexion    Elbow extension    Wrist flexion    Wrist extension    Wrist ulnar deviation    Wrist radial deviation    Wrist pronation    Wrist supination    Grip strength (lbs)    (Blank rows = not tested) * = concordant pain  SHOULDER SPECIAL TESTS: Impingement tests: Painful arc test: negative SLAP lesions: Biceps load test: negative Instability tests: Apprehension test: positive  and Posterior drawer test: positive  Rotator cuff assessment: External rotation lag sign: negative, Internal rotation lag sign: negative, and Infraspinatus test: negative Biceps assessment: Speed's test: negative  JOINT MOBILITY TESTING:  Hypermobile with anterior GH mobs  PALPATION:  mild L UT and infraspinatus insertion tenderness   TODAY'S TREATMENT:       [facility] Adult PT Treatment:                                                DATE: 03/29/23 Therapeutic Exercise: Shoulder pulleys: flexion, abduction, horizontal abd x 1 min each (with noodle) Seated scap squeezes with noodle 10x5" Seated cervical retraction 10x5" Standing Row green TB 2x10 Shoulder ext green TB 2x10 Shoulder ER at 90 deg abd reactive iso red TB 1x10 Shoulder IR at 90 deg abd reactive iso red TB 1x10 Shoulder ER red TB 2x10 "W" reactive iso red TB 2x10 Shoulder flexion red TB 2x10 Shoulder scaption red TB 2x10 Low trap setting 2x10    [facility] Adult PT Treatment:  DATE: 03/25/2023 Therapeutic Exercise: Shoulder pulleys:  flexion, scaption, abduction x 1 min each (with noodle) Seated scap squeezes with noodle 10x5" Seated cervical retraction with noodle (L finger) 10x3" Standing: No monies RTB --> YTB x10 Straight arm shoulder horizontal abd RTB x10 L shoulder ER RTB 2x10 "W" RTB x10 Rows RTB x15 SA wall slides Shoulder extension RTB x10 Shoulder ER isometric step out RTB x10 Doorway pec stretch 3x30" UBE L2 x fwd/47min bkwd   [facility] Adult PT Treatment:                                                DATE: 03/23/23 Therapeutic Exercise: Pulleys flexion, scaption, horizontal abd x1 min each Seated scapular retraction 2x10x3 sec Cervical retraction 2x10x3 sec Shoulder ER red against pool noodle TB 2x10 "W" red TB against pool noodle 2x10 Shoulder horizontal abd red TB against pool noodle 2x10 Row red TB 2x10 Shoulder ext red TB 2x10 Doorway pec stretch 60 and 90 deg 2x30 sec each Shoulder flexion red TB 2x10 Shoulder abduction red TB 2x10                                                                                                                                      PATIENT EDUCATION: Education details: Updated HEP Person educated: Patient and Child(ren) Education method: Explanation, Demonstration, and Handouts Education comprehension: verbalized understanding, returned demonstration, and needs further education  HOME EXERCISE PROGRAM: Access Code: [email or web address] URL: [email or web address] Date: 03/25/2023 Prepared by: Carlynn Herald  Exercises - Seated Scapular Retraction  - 1 x daily - 7 x weekly - 2 sets - 10 reps - 3 sec hold - Standing Bilateral Low Shoulder Row with Anchored Resistance  - 1 x daily - 7 x weekly - 2 sets - 10 reps - Shoulder External Rotation and Scapular Retraction with Resistance  - 1 x daily - 7 x weekly - 2 sets - 10 reps - 3 sec hold - Shoulder W - External Rotation with Resistance  - 1 x daily - 7 x weekly - 2 sets - 10 reps - 3 sec hold - Standing  Shoulder Horizontal Abduction with Resistance  - 1 x daily - 7 x weekly - 2 sets - 10 reps - 3 sec hold - Standing Cervical Retraction  - 1 x daily - 7 x weekly - 2 sets - 10 reps - 3 sec hold - Doorway Pec Stretch at 90 Degrees Abduction  - 1 x daily - 7 x weekly - 3 sets - 10 reps - Shoulder External Rotation with Anchored Resistance  - 1 x daily - 7 x weekly - 3 sets - 10 reps  ASSESSMENT:  CLINICAL IMPRESSION:  Continued to work on rotator cuff and scapular strengthening. Pt is now able to  use green TB for scapular strengthening. She was able to perform shoulder ext without any pain this session. Still only able to tolerate red TB for RTC. She is progressing well towards her goals.   From eval: Patient is a 82 y.o. F who was seen today for physical therapy evaluation and treatment for L shoulder instability. Assessment significant for general rotator cuff and posterior shoulder weakness (L worse than R) leading to increased pain particularly with shoulder extension. No overt difference with shoulder ROM between L & R. Special testing demos s/s consistent with anterior shoulder instability. Pt would benefit from PT to address these issues for return to full function.   OBJECTIVE IMPAIRMENTS: decreased activity tolerance, decreased mobility, decreased strength, increased fascial restrictions, impaired UE functional use, improper body mechanics, postural dysfunction, and pain.    GOALS: Goals reviewed with patient? Yes  SHORT TERM GOALS: Target date: 04/15/2023   Pt will be ind with initial HEP Baseline: Goal status: INITIAL  2.  Pt will be able to perform shoulder ext against resistance without any pain or discomfort Baseline:  Goal status: MET 03/29/23    LONG TERM GOALS: Target date: 05/13/2023  Pt will be ind with management and progression of symptoms Baseline:  Goal status: INITIAL  2.  Pt will report no instances of pain with all shoulder motion Baseline:  Goal status:  INITIAL  3.  Pt will have 0% on quick DASH Baseline:  Goal status: INITIAL   PLAN:  PT FREQUENCY: 2x/week  PT DURATION: 8 weeks  PLANNED INTERVENTIONS: Therapeutic exercises, Therapeutic activity, Neuromuscular re-education, Balance training, Gait training, Patient/Family education, Self Care, Joint mobilization, Aquatic Therapy, Dry Needling, Electrical stimulation, Cryotherapy, Moist heat, Taping, Vasopneumatic device, Ionotophoresis 4mg /ml Dexamethasone, Manual therapy, and Re-evaluation  PLAN FOR NEXT SESSION: Continue gross rotator cuff and posterior scap strengthening.    Leonie Amacher April Ma L Nicholi Ghuman, PT 03/29/2023, 9:33 AM

## 2023-03-31 ENCOUNTER — Ambulatory Visit: Payer: Medicare Other

## 2023-03-31 DIAGNOSIS — R293 Abnormal posture: Secondary | ICD-10-CM | POA: Diagnosis not present

## 2023-03-31 DIAGNOSIS — M25512 Pain in left shoulder: Secondary | ICD-10-CM

## 2023-03-31 DIAGNOSIS — M6281 Muscle weakness (generalized): Secondary | ICD-10-CM

## 2023-03-31 NOTE — Therapy (Signed)
OUTPATIENT PHYSICAL THERAPY SHOULDER TREATMENT   Patient Name: Shirley James MRN: [id number] DOB:27-Feb-1941, 82 y.o., female Today's Date: 03/31/2023  END OF SESSION:  PT End of Session - 03/31/23 0847     Visit Number 5    Number of Visits 16    Date for PT Re-Evaluation 05/13/23    Authorization Type [employer]    Progress Note Due on Visit 10    PT Start Time 0845    PT Stop Time 0926    PT Time Calculation (min) 41 min    Activity Tolerance Patient tolerated treatment well    Behavior During Therapy WFL for tasks assessed/performed               Past Medical History:  Diagnosis Date   Hypertension    Past Surgical History:  Procedure Laterality Date   ABDOMINAL HYSTERECTOMY     LAPAROSCOPIC BILATERAL SALPINGO OOPHERECTOMY Bilateral 2005   TONSILECTOMY, ADENOIDECTOMY, BILATERAL MYRINGOTOMY AND TUBES     Patient Active Problem List   Diagnosis Date Noted   Primary osteoarthritis of left shoulder 03/07/2023   Acute pain of left shoulder 03/02/2023   Mild episode of recurrent major depressive disorder (HCC) 10/08/2022   Irritability 10/08/2022   Carotid atherosclerosis, bilateral 07/08/2021   Low serum vitamin B12 06/23/2021   Elevated LDL cholesterol level 06/23/2021   Amnestic MCI (mild cognitive impairment with memory loss) 12/29/2020   Osteopenia 12/22/2018   Thyroid nodule 12/13/2018   Right carotid bruit 12/04/2018   Bilateral carpal tunnel syndrome 12/19/2017   Tinnitus of both ears 12/19/2017   Paresthesia of both hands 07/11/2017   LUQ pain 03/16/2016   B12 deficiency 03/23/2015   Vitamin D deficiency 03/23/2015   Other fatigue 03/23/2015   Heart murmur, systolic 05/13/2014   PND (post-nasal drip) 11/07/2013   Essential hypertension, benign 08/03/2013   Insomnia 08/03/2013    PCP: Jomarie Longs, PA-C  REFERRING PROVIDER: Jomarie Longs, PA-C  REFERRING DIAG: [id number] (ICD-10-CM) - Localized osteoarthritis of left  shoulder  THERAPY DIAG:  Abnormal posture  Acute pain of left shoulder  Muscle weakness (generalized)  Rationale for Evaluation and Treatment: Rehabilitation  ONSET DATE: ~2 months  SUBJECTIVE:                                                                                                                                                                                      SUBJECTIVE STATEMENT: Patient states there were a couple times her shoulder seemed to want to pop but didn't. Hand dominance: Right  PERTINENT HISTORY: None  PAIN:  Are you having pain? Yes: NPRS scale: 0 currently, 10 at most/10  Pain location: Feels L shoulder pops forward Pain description: sharp and sudden Aggravating factors: "out of the blue" Relieving factors: has not had to use  PRECAUTIONS: None  WEIGHT BEARING RESTRICTIONS: No  FALLS:  Has patient fallen in last 6 months? No  LIVING ENVIRONMENT: Lives with: lives with their spouse Lives in: House/apartment Stairs: No Has following equipment at home: None  OCCUPATION: Retired [employer] fundraiser  PLOF: Independent  PATIENT GOALS: No pain with all mobility  NEXT MD VISIT:   OBJECTIVE:   DIAGNOSTIC FINDINGS:  L shoulder x-ray 03/04/23 IMPRESSION: 1. Moderate glenohumeral and mild acromioclavicular osteoarthritis. 2. Moderate to high-grade lateral downsloping of the acromion with high-grade distal lateral subacromial spurs that nearly contacts the distal lateral humeral head. Acromial and adjacent humeral head bone hypertrophy likely from chronic subacromial impingement.  PATIENT SURVEYS:  Quick DASH: 6.8%  POSTURE: Scapulae abducted, forward head, rounded shoulders  UPPER EXTREMITY ROM:   Active ROM Right eval Left eval  Shoulder flexion 120 120 *some pain bring arm down  Shoulder extension 60 55*  Shoulder abduction 125 120  Shoulder adduction    Shoulder internal rotation T12 T12  Shoulder external rotation 45 35  Elbow flexion     Elbow extension    Wrist flexion    Wrist extension    Wrist ulnar deviation    Wrist radial deviation    Wrist pronation    Wrist supination    (Blank rows = not tested) * = concordant pain  UPPER EXTREMITY MMT:  MMT Right eval Left eval  Shoulder flexion 5 3+  Shoulder extension 5 3+*  Shoulder abduction 5 3+  Shoulder adduction    Shoulder internal rotation 5 4  Shoulder external rotation 5 3+  Middle trapezius    Lower trapezius    Elbow flexion    Elbow extension    Wrist flexion    Wrist extension    Wrist ulnar deviation    Wrist radial deviation    Wrist pronation    Wrist supination    Grip strength (lbs)    (Blank rows = not tested) * = concordant pain  SHOULDER SPECIAL TESTS: Impingement tests: Painful arc test: negative SLAP lesions: Biceps load test: negative Instability tests: Apprehension test: positive  and Posterior drawer test: positive  Rotator cuff assessment: External rotation lag sign: negative, Internal rotation lag sign: negative, and Infraspinatus test: negative Biceps assessment: Speed's test: negative  JOINT MOBILITY TESTING:  Hypermobile with anterior GH mobs  PALPATION:  mild L UT and infraspinatus insertion tenderness   TODAY'S TREATMENT:   [facility] Adult PT Treatment:                                                DATE: 03/31/2023 Therapeutic Exercise: Shoulder pulleys flexion, scaption, horizontal abd x 1 min each Seated cervical retraction 10x5" Seated scap squeezes with noodle 10x5" Shoulder flexion with 2#dowel (noodle horiz at back) x 10 Standing: Shoulder flexion & abduction (L) arm raises YTB x10 each Shoulder ER & "W" RTB x15 each UE D2 (L) YTB x10 Rows GTB 2x10x3" Shoulder ER/IR isometric step out RTB x12 each Wall slide shoulder flexion stretch Bent arm shoulder flexion x10 (green bolster b/w elbows) SA wall slides + lift off at top x10 Doorway pec stretch 3x30"        [facility] Adult PT Treatment:  DATE: 03/29/23 Therapeutic Exercise: Shoulder pulleys: flexion, abduction, horizontal abd x 1 min each (with noodle) Seated scap squeezes with noodle 10x5" Seated cervical retraction 10x5" Standing Row green TB 2x10 Shoulder ext green TB 2x10 Shoulder ER at 90 deg abd reactive iso red TB 1x10 Shoulder IR at 90 deg abd reactive iso red TB 1x10 Shoulder ER red TB 2x10 "W" reactive iso red TB 2x10 Shoulder flexion red TB 2x10 Shoulder scaption red TB 2x10 Low trap setting 2x10   [facility] Adult PT Treatment:                                                DATE: 03/25/2023 Therapeutic Exercise: Shoulder pulleys: flexion, scaption, abduction x 1 min each (with noodle) Seated scap squeezes with noodle 10x5" Seated cervical retraction with noodle (L finger) 10x3" Standing: No monies RTB --> YTB x10 Straight arm shoulder horizontal abd RTB x10 L shoulder ER RTB 2x10 "W" RTB x10 Rows RTB x15 SA wall slides Shoulder extension RTB x10 Shoulder ER isometric step out RTB x10 Doorway pec stretch 3x30" UBE L2 x fwd/50min bkwd                                                                                                                                      PATIENT EDUCATION: Education details: Updated HEP Person educated: Patient and Child(ren) Education method: Explanation, Demonstration, and Handouts Education comprehension: verbalized understanding, returned demonstration, and needs further education  HOME EXERCISE PROGRAM: Access Code: [email or web address] URL: [email or web address] Date: 03/25/2023 Prepared by: Carlynn Herald  Exercises - Seated Scapular Retraction  - 1 x daily - 7 x weekly - 2 sets - 10 reps - 3 sec hold - Standing Bilateral Low Shoulder Row with Anchored Resistance  - 1 x daily - 7 x weekly - 2 sets - 10 reps - Shoulder External Rotation and Scapular Retraction with Resistance  - 1 x daily - 7 x weekly - 2 sets - 10 reps - 3 sec  hold - Shoulder W - External Rotation with Resistance  - 1 x daily - 7 x weekly - 2 sets - 10 reps - 3 sec hold - Standing Shoulder Horizontal Abduction with Resistance  - 1 x daily - 7 x weekly - 2 sets - 10 reps - 3 sec hold - Standing Cervical Retraction  - 1 x daily - 7 x weekly - 2 sets - 10 reps - 3 sec hold - Doorway Pec Stretch at 90 Degrees Abduction  - 1 x daily - 7 x weekly - 3 sets - 10 reps - Shoulder External Rotation with Anchored Resistance  - 1 x daily - 7 x weekly - 3 sets - 10 reps  ASSESSMENT:  CLINICAL IMPRESSION:  Rotator cuff strengthening interventions continued; occasional  tactile cues provided to improve scapula retraction and depression mechanics. Improved postural alignment noted during standing rows.   From eval: Patient is a 82 y.o. F who was seen today for physical therapy evaluation and treatment for L shoulder instability. Assessment significant for general rotator cuff and posterior shoulder weakness (L worse than R) leading to increased pain particularly with shoulder extension. No overt difference with shoulder ROM between L & R. Special testing demos s/s consistent with anterior shoulder instability. Pt would benefit from PT to address these issues for return to full function.   OBJECTIVE IMPAIRMENTS: decreased activity tolerance, decreased mobility, decreased strength, increased fascial restrictions, impaired UE functional use, improper body mechanics, postural dysfunction, and pain.    GOALS: Goals reviewed with patient? Yes  SHORT TERM GOALS: Target date: 04/15/2023  Pt will be ind with initial HEP Baseline: Goal status: INITIAL  2.  Pt will be able to perform shoulder ext against resistance without any pain or discomfort Baseline:  Goal status: MET 03/29/23    LONG TERM GOALS: Target date: 05/13/2023  Pt will be ind with management and progression of symptoms Baseline:  Goal status: INITIAL  2.  Pt will report no instances of pain with all  shoulder motion Baseline:  Goal status: INITIAL  3.  Pt will have 0% on quick DASH Baseline:  Goal status: INITIAL   PLAN:  PT FREQUENCY: 2x/week  PT DURATION: 8 weeks  PLANNED INTERVENTIONS: Therapeutic exercises, Therapeutic activity, Neuromuscular re-education, Balance training, Gait training, Patient/Family education, Self Care, Joint mobilization, Aquatic Therapy, Dry Needling, Electrical stimulation, Cryotherapy, Moist heat, Taping, Vasopneumatic device, Ionotophoresis 4mg /ml Dexamethasone, Manual therapy, and Re-evaluation  PLAN FOR NEXT SESSION: Progress gross rotator cuff and posterior scap strengthening.    Sanjuana Mae, PTA 03/31/2023, 9:27 AM

## 2023-04-04 ENCOUNTER — Ambulatory Visit: Payer: Medicare Other

## 2023-04-04 DIAGNOSIS — M6281 Muscle weakness (generalized): Secondary | ICD-10-CM

## 2023-04-04 DIAGNOSIS — M25512 Pain in left shoulder: Secondary | ICD-10-CM

## 2023-04-04 DIAGNOSIS — R293 Abnormal posture: Secondary | ICD-10-CM

## 2023-04-04 NOTE — Therapy (Signed)
OUTPATIENT PHYSICAL THERAPY SHOULDER TREATMENT   Patient Name: Shirley James MRN: [id number] DOB:1941-06-03, 82 y.o., female Today's Date: 04/04/2023  END OF SESSION:  PT End of Session - 04/04/23 1104     Visit Number 6    Number of Visits 16    Date for PT Re-Evaluation 05/13/23    Authorization Type [employer] Medicare    Progress Note Due on Visit 10    PT Start Time 1105    PT Stop Time 1143    PT Time Calculation (min) 38 min    Activity Tolerance Patient tolerated treatment well    Behavior During Therapy WFL for tasks assessed/performed               Past Medical History:  Diagnosis Date   Hypertension    Past Surgical History:  Procedure Laterality Date   ABDOMINAL HYSTERECTOMY     LAPAROSCOPIC BILATERAL SALPINGO OOPHERECTOMY Bilateral 2005   TONSILECTOMY, ADENOIDECTOMY, BILATERAL MYRINGOTOMY AND TUBES     Patient Active Problem List   Diagnosis Date Noted   Primary osteoarthritis of left shoulder 03/07/2023   Acute pain of left shoulder 03/02/2023   Mild episode of recurrent major depressive disorder (HCC) 10/08/2022   Irritability 10/08/2022   Carotid atherosclerosis, bilateral 07/08/2021   Low serum vitamin B12 06/23/2021   Elevated LDL cholesterol level 06/23/2021   Amnestic MCI (mild cognitive impairment with memory loss) 12/29/2020   Osteopenia 12/22/2018   Thyroid nodule 12/13/2018   Right carotid bruit 12/04/2018   Bilateral carpal tunnel syndrome 12/19/2017   Tinnitus of both ears 12/19/2017   Paresthesia of both hands 07/11/2017   LUQ pain 03/16/2016   B12 deficiency 03/23/2015   Vitamin D deficiency 03/23/2015   Other fatigue 03/23/2015   Heart murmur, systolic 05/13/2014   PND (post-nasal drip) 11/07/2013   Essential hypertension, benign 08/03/2013   Insomnia 08/03/2013    PCP: Jomarie Longs, PA-C  REFERRING PROVIDER: Jomarie Longs, PA-C  REFERRING DIAG: [id number] (ICD-10-CM) - Localized osteoarthritis of left  shoulder  THERAPY DIAG:  Abnormal posture  Muscle weakness (generalized)  Acute pain of left shoulder  Rationale for Evaluation and Treatment: Rehabilitation  ONSET DATE: ~2 months  SUBJECTIVE:                                                                                                                                                                                      SUBJECTIVE STATEMENT: Patient reports she had a good weekend with no shoulder pain or the sensation that her shoulder needs to "pop". Hand dominance: Right  PERTINENT HISTORY: None  PAIN:  Are you having pain? Yes: NPRS scale: 0  currently, 10 at most/10 Pain location: Feels L shoulder pops forward Pain description: sharp and sudden Aggravating factors: "out of the blue" Relieving factors: has not had to use  PRECAUTIONS: None  WEIGHT BEARING RESTRICTIONS: No  FALLS:  Has patient fallen in last 6 months? No  LIVING ENVIRONMENT: Lives with: lives with their spouse Lives in: House/apartment Stairs: No Has following equipment at home: None  OCCUPATION: Retired [employer] fundraiser  PLOF: Independent  PATIENT GOALS: No pain with all mobility  NEXT MD VISIT:   OBJECTIVE:   DIAGNOSTIC FINDINGS:  L shoulder x-ray 03/04/23 IMPRESSION: 1. Moderate glenohumeral and mild acromioclavicular osteoarthritis. 2. Moderate to high-grade lateral downsloping of the acromion with high-grade distal lateral subacromial spurs that nearly contacts the distal lateral humeral head. Acromial and adjacent humeral head bone hypertrophy likely from chronic subacromial impingement.  PATIENT SURVEYS:  Quick DASH: 6.8%  POSTURE: Scapulae abducted, forward head, rounded shoulders  UPPER EXTREMITY ROM:   Active ROM Right eval Left eval  Shoulder flexion 120 120 *some pain bring arm down  Shoulder extension 60 55*  Shoulder abduction 125 120  Shoulder adduction    Shoulder internal rotation T12 T12  Shoulder external rotation 45  35  Elbow flexion    Elbow extension    Wrist flexion    Wrist extension    Wrist ulnar deviation    Wrist radial deviation    Wrist pronation    Wrist supination    (Blank rows = not tested) * = concordant pain  UPPER EXTREMITY MMT:  MMT Right eval Left eval Left 04/04/23  Shoulder flexion 5 3+ 4+  Shoulder extension 5 3+* 4+  Shoulder abduction 5 3+ 4+  Shoulder adduction     Shoulder internal rotation 5 4 4+  Shoulder external rotation 5 3+ 4+  Middle trapezius     Lower trapezius     Elbow flexion     Elbow extension     Wrist flexion     Wrist extension     Wrist ulnar deviation     Wrist radial deviation     Wrist pronation     Wrist supination     Grip strength (lbs)     (Blank rows = not tested) * = concordant pain  SHOULDER SPECIAL TESTS: Impingement tests: Painful arc test: negative SLAP lesions: Biceps load test: negative Instability tests: Apprehension test: positive  and Posterior drawer test: positive  Rotator cuff assessment: External rotation lag sign: negative, Internal rotation lag sign: negative, and Infraspinatus test: negative Biceps assessment: Speed's test: negative  JOINT MOBILITY TESTING:  Hypermobile with anterior GH mobs  PALPATION:  mild L UT and infraspinatus insertion tenderness   TODAY'S TREATMENT:   [facility] Adult PT Treatment:                                                DATE: 04/04/2023 Therapeutic Exercise: UBE L2 x 2 min fwd/2 min bkwd Standing: Scap squeezes x10 No monies RTB 2x10 (towels under arms) Rows GTB 2x10x3" "W" no resistance --> added cervical retraction and pec stretch hold 5x5" Shoulder ER/IR isometric step out RTB x15 B Bent arm shoulder flexion x10 (green bolster b/w elbows) SA wall slides x 10 (narrow V)  Low trap setting (arm lift away wall) x10 L shoulder flexion stretch in doorway x30" Doorway pec stretch 3x30" Shoulder MMT (see  above)    [facility] Adult PT Treatment:                                                 DATE: 03/31/2023 Therapeutic Exercise: Shoulder pulleys flexion, scaption, horizontal abd x 1 min each Seated cervical retraction 10x5" Seated scap squeezes with noodle 10x5" Shoulder flexion with 2#dowel (noodle horiz at back) x 10 Standing: Shoulder flexion & abduction (L) arm raises YTB x10 each Shoulder ER & "W" RTB x15 each UE D2 (L) YTB x10 Rows GTB 2x10x3" Shoulder ER/IR isometric step out RTB x12 each Bent arm shoulder flexion x10 (green bolster b/w elbows) Wall slide shoulder flexion stretch SA wall slides + lift off at top x10 Doorway pec stretch 3x30"                                             PATIENT EDUCATION: Education details: Updated HEP Person educated: Patient and Child(ren) Education method: Explanation, Demonstration, and Handouts Education comprehension: verbalized understanding, returned demonstration, and needs further education  HOME EXERCISE PROGRAM: Access Code: [email or web address] URL: [email or web address] Date: 03/25/2023 Prepared by: Carlynn Herald  Exercises - Seated Scapular Retraction  - 1 x daily - 7 x weekly - 2 sets - 10 reps - 3 sec hold - Standing Bilateral Low Shoulder Row with Anchored Resistance  - 1 x daily - 7 x weekly - 2 sets - 10 reps - Shoulder External Rotation and Scapular Retraction with Resistance  - 1 x daily - 7 x weekly - 2 sets - 10 reps - 3 sec hold - Shoulder W - External Rotation with Resistance  - 1 x daily - 7 x weekly - 2 sets - 10 reps - 3 sec hold - Standing Shoulder Horizontal Abduction with Resistance  - 1 x daily - 7 x weekly - 2 sets - 10 reps - 3 sec hold - Standing Cervical Retraction  - 1 x daily - 7 x weekly - 2 sets - 10 reps - 3 sec hold - Doorway Pec Stretch at 90 Degrees Abduction  - 1 x daily - 7 x weekly - 3 sets - 10 reps - Shoulder External Rotation with Anchored Resistance  - 1 x daily - 7 x weekly - 3 sets - 10 reps  ASSESSMENT:  CLINICAL IMPRESSION:  Patient is making good  progress with less subjective shoulder pain and demonstrating improved postural alignment. Patient continues to require  cueing to decrease forward head and rounded shoulder posture; occasional upper trapezius compensation noted during resisted postural exercises.  From eval: Patient is a 82 y.o. F who was seen today for physical therapy evaluation and treatment for L shoulder instability. Assessment significant for general rotator cuff and posterior shoulder weakness (L worse than R) leading to increased pain particularly with shoulder extension. No overt difference with shoulder ROM between L & R. Special testing demos s/s consistent with anterior shoulder instability. Pt would benefit from PT to address these issues for return to full function.   OBJECTIVE IMPAIRMENTS: decreased activity tolerance, decreased mobility, decreased strength, increased fascial restrictions, impaired UE functional use, improper body mechanics, postural dysfunction, and pain.    GOALS: Goals reviewed with patient? Yes  SHORT TERM GOALS: Target date: 04/15/2023  Pt will be ind with initial HEP Baseline: Goal status: IN PROGRESS  2.  Pt will be able to perform shoulder ext against resistance without any pain or discomfort Baseline:  Goal status: MET 03/29/23    LONG TERM GOALS: Target date: 05/13/2023  Pt will be ind with management and progression of symptoms Baseline:  Goal status: INITIAL  2.  Pt will report no instances of pain with all shoulder motion Baseline:  Goal status: INITIAL  3.  Pt will have 0% on quick DASH Baseline:  Goal status: INITIAL   PLAN:  PT FREQUENCY: 2x/week  PT DURATION: 8 weeks  PLANNED INTERVENTIONS: Therapeutic exercises, Therapeutic activity, Neuromuscular re-education, Balance training, Gait training, Patient/Family education, Self Care, Joint mobilization, Aquatic Therapy, Dry Needling, Electrical stimulation, Cryotherapy, Moist heat, Taping, Vasopneumatic device,  Ionotophoresis 4mg /ml Dexamethasone, Manual therapy, and Re-evaluation  PLAN FOR NEXT SESSION: Progress gross rotator cuff and posterior scap strengthening.    Sanjuana Mae, PTA 04/04/2023, 11:42 AM

## 2023-04-07 ENCOUNTER — Encounter: Payer: Self-pay | Admitting: Physical Therapy

## 2023-04-07 ENCOUNTER — Ambulatory Visit: Payer: Medicare Other | Admitting: Physical Therapy

## 2023-04-07 DIAGNOSIS — M25512 Pain in left shoulder: Secondary | ICD-10-CM

## 2023-04-07 DIAGNOSIS — M6281 Muscle weakness (generalized): Secondary | ICD-10-CM

## 2023-04-07 DIAGNOSIS — R293 Abnormal posture: Secondary | ICD-10-CM

## 2023-04-07 NOTE — Therapy (Signed)
OUTPATIENT PHYSICAL THERAPY SHOULDER TREATMENT   Patient Name: Shirley James MRN: [id number] DOB:August 01, 1941, 82 y.o., female Today's Date: 04/07/2023  END OF SESSION:  PT End of Session - 04/07/23 0931     Visit Number 7    Number of Visits 16    Date for PT Re-Evaluation 05/13/23    Authorization Type [employer]    Progress Note Due on Visit 10    PT Start Time 0931    PT Stop Time 1015    PT Time Calculation (min) 44 min    Activity Tolerance Patient tolerated treatment well    Behavior During Therapy WFL for tasks assessed/performed               Past Medical History:  Diagnosis Date   Hypertension    Past Surgical History:  Procedure Laterality Date   ABDOMINAL HYSTERECTOMY     LAPAROSCOPIC BILATERAL SALPINGO OOPHERECTOMY Bilateral 2005   TONSILECTOMY, ADENOIDECTOMY, BILATERAL MYRINGOTOMY AND TUBES     Patient Active Problem List   Diagnosis Date Noted   Primary osteoarthritis of left shoulder 03/07/2023   Acute pain of left shoulder 03/02/2023   Mild episode of recurrent major depressive disorder (HCC) 10/08/2022   Irritability 10/08/2022   Carotid atherosclerosis, bilateral 07/08/2021   Low serum vitamin B12 06/23/2021   Elevated LDL cholesterol level 06/23/2021   Amnestic MCI (mild cognitive impairment with memory loss) 12/29/2020   Osteopenia 12/22/2018   Thyroid nodule 12/13/2018   Right carotid bruit 12/04/2018   Bilateral carpal tunnel syndrome 12/19/2017   Tinnitus of both ears 12/19/2017   Paresthesia of both hands 07/11/2017   LUQ pain 03/16/2016   B12 deficiency 03/23/2015   Vitamin D deficiency 03/23/2015   Other fatigue 03/23/2015   Heart murmur, systolic 05/13/2014   PND (post-nasal drip) 11/07/2013   Essential hypertension, benign 08/03/2013   Insomnia 08/03/2013    PCP: Jomarie Longs, PA-C  REFERRING PROVIDER: Jomarie Longs, PA-C  REFERRING DIAG: [phone] (ICD-10-CM) - Localized osteoarthritis of left  shoulder  THERAPY DIAG:  Abnormal posture  Muscle weakness (generalized)  Acute pain of left shoulder  Rationale for Evaluation and Treatment: Rehabilitation  ONSET DATE: ~2 months  SUBJECTIVE:                                                                                                                                                                                      SUBJECTIVE STATEMENT: Pt reports no instances of shoulder pain or popping.  Hand dominance: Right  PERTINENT HISTORY: None  PAIN:  Are you having pain? Yes: NPRS scale: 0 currently, 10 at most/10 Pain location: Feels L shoulder pops  forward Pain description: sharp and sudden Aggravating factors: "out of the blue" Relieving factors: has not had to use  PRECAUTIONS: None  WEIGHT BEARING RESTRICTIONS: No  FALLS:  Has patient fallen in last 6 months? No  LIVING ENVIRONMENT: Lives with: lives with their spouse Lives in: House/apartment Stairs: No Has following equipment at home: None  OCCUPATION: Retired [employer] fundraiser  PLOF: Independent  PATIENT GOALS: No pain with all mobility  NEXT MD VISIT:   OBJECTIVE:   DIAGNOSTIC FINDINGS:  L shoulder x-ray 03/04/23 IMPRESSION: 1. Moderate glenohumeral and mild acromioclavicular osteoarthritis. 2. Moderate to high-grade lateral downsloping of the acromion with high-grade distal lateral subacromial spurs that nearly contacts the distal lateral humeral head. Acromial and adjacent humeral head bone hypertrophy likely from chronic subacromial impingement.  PATIENT SURVEYS:  Quick DASH: 6.8%  POSTURE: Scapulae abducted, forward head, rounded shoulders  UPPER EXTREMITY ROM:   Active ROM Right eval Left eval Left 04/07/23  Shoulder flexion 120 120 *some pain bring arm down 120 * mild pain  Shoulder extension 60 55* 80  Shoulder abduction 125 120 130  Shoulder adduction     Shoulder internal rotation T12 T12 T10  Shoulder external rotation 45 35 45  Elbow  flexion     Elbow extension     Wrist flexion     Wrist extension     Wrist ulnar deviation     Wrist radial deviation     Wrist pronation     Wrist supination     (Blank rows = not tested) * = concordant pain  UPPER EXTREMITY MMT:  MMT Right eval Left eval Left 04/04/23  Shoulder flexion 5 3+ 4+  Shoulder extension 5 3+* 4+  Shoulder abduction 5 3+ 4+  Shoulder adduction     Shoulder internal rotation 5 4 4+  Shoulder external rotation 5 3+ 4+  Middle trapezius     Lower trapezius     Elbow flexion     Elbow extension     Wrist flexion     Wrist extension     Wrist ulnar deviation     Wrist radial deviation     Wrist pronation     Wrist supination     Grip strength (lbs)     (Blank rows = not tested) * = concordant pain  SHOULDER SPECIAL TESTS: Impingement tests: Painful arc test: negative SLAP lesions: Biceps load test: negative Instability tests: Apprehension test: positive  and Posterior drawer test: positive  Rotator cuff assessment: External rotation lag sign: negative, Internal rotation lag sign: negative, and Infraspinatus test: negative Biceps assessment: Speed's test: negative  JOINT MOBILITY TESTING:  Hypermobile with anterior GH mobs  PALPATION:  mild L UT and infraspinatus insertion tenderness   TODAY'S TREATMENT:  [facility] Adult PT Treatment:                                                DATE: 04/07/23 Therapeutic Exercise: UBE L3, 2 min fwd, 2 min bwd Doorway pec stretch 2x30 sec Standing against pool noodle: Scap retraction 2x10 Cervical retraction x10 Shoulder ER red TB 2x10 W red TB 2x10 Shoulder horizontal abd red TB 2x10 Shoulder flexion red TB x10 R&L Shoulder abd red TB x10  Standing: Shoulder ext green TB 2x10 Rows green TB 2x10 SA wall slides x5, x10 yellow TB Low trap setting 2x10 Seated shoulder  flexion AROM x10   [facility] Adult PT Treatment:                                                DATE: 04/04/2023 Therapeutic  Exercise: UBE L2 x 2 min fwd/2 min bkwd Standing: Scap squeezes x10 No monies RTB 2x10 (towels under arms) Rows GTB 2x10x3" "W" no resistance --> added cervical retraction and pec stretch hold 5x5" Shoulder ER/IR isometric step out RTB x15 B Bent arm shoulder flexion x10 (green bolster b/w elbows) SA wall slides x 10 (narrow V)  Low trap setting (arm lift away wall) x10 L shoulder flexion stretch in doorway x30" Doorway pec stretch 3x30" Shoulder MMT (see above)                                              PATIENT EDUCATION: Education details: Updated HEP Person educated: Patient and Child(ren) Education method: Explanation, Demonstration, and Handouts Education comprehension: verbalized understanding, returned demonstration, and needs further education  HOME EXERCISE PROGRAM: Access Code: [email or web address] URL: [email or web address] Date: 03/25/2023 Prepared by: Carlynn Herald  Exercises - Seated Scapular Retraction  - 1 x daily - 7 x weekly - 2 sets - 10 reps - 3 sec hold - Standing Bilateral Low Shoulder Row with Anchored Resistance  - 1 x daily - 7 x weekly - 2 sets - 10 reps - Shoulder External Rotation and Scapular Retraction with Resistance  - 1 x daily - 7 x weekly - 2 sets - 10 reps - 3 sec hold - Shoulder W - External Rotation with Resistance  - 1 x daily - 7 x weekly - 2 sets - 10 reps - 3 sec hold - Standing Shoulder Horizontal Abduction with Resistance  - 1 x daily - 7 x weekly - 2 sets - 10 reps - 3 sec hold - Standing Cervical Retraction  - 1 x daily - 7 x weekly - 2 sets - 10 reps - 3 sec hold - Doorway Pec Stretch at 90 Degrees Abduction  - 1 x daily - 7 x weekly - 3 sets - 10 reps - Shoulder External Rotation with Anchored Resistance  - 1 x daily - 7 x weekly - 3 sets - 10 reps  ASSESSMENT:  CLINICAL IMPRESSION:  Pt has been progressing very well with her exercises. Has had no instances of pain at home. Pt's greatest deficits left is a little end range  pain with shoulder flexion with increased UT activation. Pt to work on end range shoulder flexion this weekend. Pt feels ready to d/c sometime next week.   From eval: Patient is a 82 y.o. F who was seen today for physical therapy evaluation and treatment for L shoulder instability. Assessment significant for general rotator cuff and posterior shoulder weakness (L worse than R) leading to increased pain particularly with shoulder extension. No overt difference with shoulder ROM between L & R. Special testing demos s/s consistent with anterior shoulder instability. Pt would benefit from PT to address these issues for return to full function.   OBJECTIVE IMPAIRMENTS: decreased activity tolerance, decreased mobility, decreased strength, increased fascial restrictions, impaired UE functional use, improper body mechanics, postural dysfunction, and pain.    GOALS: Goals reviewed with patient? Yes  SHORT TERM GOALS: Target date: 04/15/2023  Pt will be ind with initial HEP Baseline: Goal status: MET 04/07/23  2.  Pt will be able to perform shoulder ext against resistance without any pain or discomfort Baseline:  Goal status: MET 03/29/23    LONG TERM GOALS: Target date: 05/13/2023  Pt will be ind with management and progression of symptoms Baseline:  Goal status: INITIAL  2.  Pt will report no instances of pain with all shoulder motion Baseline:  Goal status: INITIAL  3.  Pt will have 0% on quick DASH Baseline:  Goal status: INITIAL   PLAN:  PT FREQUENCY: 2x/week  PT DURATION: 8 weeks  PLANNED INTERVENTIONS: Therapeutic exercises, Therapeutic activity, Neuromuscular re-education, Balance training, Gait training, Patient/Family education, Self Care, Joint mobilization, Aquatic Therapy, Dry Needling, Electrical stimulation, Cryotherapy, Moist heat, Taping, Vasopneumatic device, Ionotophoresis 4mg /ml Dexamethasone, Manual therapy, and Re-evaluation  PLAN FOR NEXT SESSION: D/C patient!  Finalize HEP. Progress gross rotator cuff and posterior scap strengthening.    Kilani Joffe April Ma L Cincere Zorn, PT 04/07/2023, 9:31 AM

## 2023-04-11 ENCOUNTER — Ambulatory Visit: Payer: Medicare Other

## 2023-04-11 DIAGNOSIS — M25512 Pain in left shoulder: Secondary | ICD-10-CM

## 2023-04-11 DIAGNOSIS — R293 Abnormal posture: Secondary | ICD-10-CM

## 2023-04-11 DIAGNOSIS — M6281 Muscle weakness (generalized): Secondary | ICD-10-CM

## 2023-04-11 NOTE — Therapy (Addendum)
OUTPATIENT PHYSICAL THERAPY SHOULDER TREATMENT AND DISCHARGE  PHYSICAL THERAPY DISCHARGE SUMMARY  Visits from Start of Care: 8  Current functional level related to goals / functional outcomes: See below. She has met all of her functional goals including QuickDASH.    Remaining deficits: See below. Strength has improved but not equal to R shoulder.   Education / Equipment: See below   Patient agrees to discharge. Patient goals were met. Patient is being discharged due to meeting the stated rehab goals.    Patient Name: Shirley James MRN: [id number] DOB:[birth year], 82 y.o., female Today's Date: 04/11/2023  END OF SESSION:  PT End of Session - 04/11/23 1059     Visit Number 8    Number of Visits 16    Date for PT Re-Evaluation 05/13/23    Authorization Type [employer] Medicare    Progress Note Due on Visit 10    PT Start Time 1100    PT Stop Time 1130    PT Time Calculation (min) 30 min    Activity Tolerance Patient tolerated treatment well    Behavior During Therapy WFL for tasks assessed/performed               Past Medical History:  Diagnosis Date   Hypertension    Past Surgical History:  Procedure Laterality Date   ABDOMINAL HYSTERECTOMY     LAPAROSCOPIC BILATERAL SALPINGO OOPHERECTOMY Bilateral 2005   TONSILECTOMY, ADENOIDECTOMY, BILATERAL MYRINGOTOMY AND TUBES     Patient Active Problem List   Diagnosis Date Noted   Primary osteoarthritis of left shoulder 03/07/2023   Acute pain of left shoulder 03/02/2023   Mild episode of recurrent major depressive disorder (HCC) 10/08/2022   Irritability 10/08/2022   Carotid atherosclerosis, bilateral 07/08/2021   Low serum vitamin B12 06/23/2021   Elevated LDL cholesterol level 06/23/2021   Amnestic MCI (mild cognitive impairment with memory loss) 12/29/2020   Osteopenia 12/22/2018   Thyroid nodule 12/13/2018   Right carotid bruit 12/04/2018   Bilateral carpal tunnel syndrome 12/19/2017   Tinnitus of both ears  12/19/2017   Paresthesia of both hands 07/11/2017   LUQ pain 03/16/2016   B12 deficiency 03/23/2015   Vitamin D deficiency 03/23/2015   Other fatigue 03/23/2015   Heart murmur, systolic 05/13/2014   PND (post-nasal drip) 11/07/2013   Essential hypertension, benign 08/03/2013   Insomnia 08/03/2013    PCP: Jomarie Longs, PA-C  REFERRING PROVIDER: Jomarie Longs, PA-C  REFERRING DIAG: [phone] (ICD-10-CM) - Localized osteoarthritis of left shoulder  THERAPY DIAG:  Abnormal posture  Muscle weakness (generalized)  Acute pain of left shoulder  Rationale for Evaluation and Treatment: Rehabilitation  ONSET DATE: ~2 months  SUBJECTIVE:  SUBJECTIVE STATEMENT: Patient reports no pain today; states she cleaned her deck over the weekend and had no shoulder pain during or afterwards. Hand dominance: Right  PERTINENT HISTORY: None  PAIN:  Are you having pain? Yes: NPRS scale: 0 currently, 10 at most/10 Pain location: Feels L shoulder pops forward Pain description: sharp and sudden Aggravating factors: "out of the blue" Relieving factors: has not had to use  PRECAUTIONS: None  WEIGHT BEARING RESTRICTIONS: No  FALLS:  Has patient fallen in last 6 months? No  LIVING ENVIRONMENT: Lives with: lives with their spouse Lives in: House/apartment Stairs: No Has following equipment at home: None  OCCUPATION: Retired Char[employer] fundraiser  PLOF: Independent  PATIENT GOALS: No pain with all mobility  NEXT MD VISIT:   OBJECTIVE:   DIAGNOSTIC FINDINGS:  L shoulder x-ray 03/04/23 IMPRESSION: 1. Moderate glenohumeral and mild acromioclavicular osteoarthritis. 2. Moderate to high-grade lateral downsloping of the acromion with high-grade distal lateral subacromial spurs that nearly contacts the distal lateral  humeral head. Acromial and adjacent humeral head bone hypertrophy likely from chronic subacromial impingement.  PATIENT SURVEYS:  Quick DASH: 6.8%  POSTURE: Scapulae abducted, forward head, rounded shoulders  UPPER EXTREMITY ROM:   Active ROM Right eval Left eval Left 04/07/23  Shoulder flexion 120 120 *some pain bring arm down 120 * mild pain  Shoulder extension 60 55* 80  Shoulder abduction 125 120 130  Shoulder adduction     Shoulder internal rotation T12 T12 T10  Shoulder external rotation 45 35 45  Elbow flexion     Elbow extension     Wrist flexion     Wrist extension     Wrist ulnar deviation     Wrist radial deviation     Wrist pronation     Wrist supination     (Blank rows = not tested) * = concordant pain  UPPER EXTREMITY MMT:  MMT Right eval Left eval Left 04/04/23  Shoulder flexion 5 3+ 4+  Shoulder extension 5 3+* 4+  Shoulder abduction 5 3+ 4+  Shoulder adduction     Shoulder internal rotation 5 4 4+  Shoulder external rotation 5 3+ 4+  Middle trapezius     Lower trapezius     Elbow flexion     Elbow extension     Wrist flexion     Wrist extension     Wrist ulnar deviation     Wrist radial deviation     Wrist pronation     Wrist supination     Grip strength (lbs)     (Blank rows = not tested) * = concordant pain  SHOULDER SPECIAL TESTS: Impingement tests: Painful arc test: negative SLAP lesions: Biceps load test: negative Instability tests: Apprehension test: positive  and Posterior drawer test: positive  Rotator cuff assessment: External rotation lag sign: negative, Internal rotation lag sign: negative, and Infraspinatus test: negative Biceps assessment: Speed's test: negative  JOINT MOBILITY TESTING:  Hypermobile with anterior GH mobs  PALPATION:  mild L UT and infraspinatus insertion tenderness   TODAY'S TREATMENT:  [facility] Adult PT Treatment:                                                DATE: 04/11/2023 Therapeutic Exercise: UBE  L4 x fwd/2 min bkwd Doorway pec stretch 2x30" Standing against noodle: Scap squeezes x 15 Cervical retraction 10x3"  Shoulder ER RTB 2x10 Shoulder horizontal abd 2x10 "W" RTB 2x10 Shoulder flexion wall slide no resistance --> RTB Low trap setting at wall Therapeutic Activity: Quick DASH Review of updated HEP, modification/progressions   [facility] Adult PT Treatment:                                                DATE: 04/07/23 Therapeutic Exercise: UBE L3, 2 min fwd, 2 min bwd Doorway pec stretch 2x30 sec Standing against pool noodle: Scap retraction 2x10 Cervical retraction x10 Shoulder ER red TB 2x10 W red TB 2x10 Shoulder horizontal abd red TB 2x10 Shoulder flexion red TB x10 R&L Shoulder abd red TB x10  Standing: Shoulder ext green TB 2x10 Rows green TB 2x10 SA wall slides x5, x10 yellow TB Low trap setting 2x10 Seated shoulder flexion AROM x10                                     PATIENT EDUCATION: Education details: Updated HEP Person educated: Patient and Child(ren) Education method: Explanation, Demonstration, and Handouts Education comprehension: verbalized understanding, returned demonstration, and needs further education  HOME EXERCISE PROGRAM: Access Code: [email or web address] URL: [email or web address] Date: 04/11/2023 Prepared by: Carlynn Herald  Exercises - Doorway Pec Stretch at 90 Degrees Abduction  - 1 x daily - 7 x weekly - 3 sets - 10 reps - Standing Cervical Retraction  - 1 x daily - 7 x weekly - 2 sets - 10 reps - 3 sec hold - Seated Scapular Retraction  - 1 x daily - 7 x weekly - 2 sets - 10 reps - 3 sec hold - Standing Bilateral Low Shoulder Row with Anchored Resistance  - 1 x daily - 7 x weekly - 2 sets - 10 reps - Shoulder External Rotation and Scapular Retraction with Resistance  - 1 x daily - 7 x weekly - 2 sets - 10 reps - 3 sec hold - Shoulder extension with resistance - Neutral  - 1 x daily - 7 x weekly - 3 sets - 10 reps - Standing  Shoulder Flexion with Resistance  - 1 x daily - 7 x weekly - 2 sets - 10 reps - Standing Single Arm Shoulder Abduction with Resistance  - 1 x daily - 7 x weekly - 2 sets - 10 reps - Low Trap Setting at Wall  - 1 x daily - 7 x weekly - 3 sets - 10 reps - Shoulder Flexion Wall Slide with Resistance Band  - 1 x daily - 7 x weekly - 3 sets - 10 reps  ASSESSMENT:  CLINICAL IMPRESSION:  Patient able to complete all postural strengthening exercises with no exacerbation of pain or other symptoms. Patient tolerated progression of shoulder flexion wall slides with resistance well with no increase in symptoms. Quick DASH score improved to 0%, indicating good progress with patient's subjective tolerance of activities of daily living.    From eval: Patient is a 82 y.o. F who was seen today for physical therapy evaluation and treatment for L shoulder instability. Assessment significant for general rotator cuff and posterior shoulder weakness (L worse than R) leading to increased pain particularly with shoulder extension. No overt difference with shoulder ROM between L & R. Special testing demos s/s consistent with anterior shoulder instability.  Pt would benefit from PT to address these issues for return to full function.   OBJECTIVE IMPAIRMENTS: decreased activity tolerance, decreased mobility, decreased strength, increased fascial restrictions, impaired UE functional use, improper body mechanics, postural dysfunction, and pain.    GOALS: Goals reviewed with patient? Yes  SHORT TERM GOALS: Target date: 04/15/2023  Pt will be ind with initial HEP Baseline: Goal status: MET 04/07/23  2.  Pt will be able to perform shoulder ext against resistance without any pain or discomfort Baseline:  Goal status: MET 03/29/23    LONG TERM GOALS: Target date: 05/13/2023  Pt will be ind with management and progression of symptoms Baseline:  Goal status: MET  2.  Pt will report no instances of pain with all shoulder  motion Baseline:  Goal status: MET  3.  Pt will have 0% on quick DASH Baseline:  Goal status: MET   PLAN:  PT FREQUENCY: 2x/week  PT DURATION: 8 weeks  PLANNED INTERVENTIONS: Therapeutic exercises, Therapeutic activity, Neuromuscular re-education, Balance training, Gait training, Patient/Family education, Self Care, Joint mobilization, Aquatic Therapy, Dry Needling, Electrical stimulation, Cryotherapy, Moist heat, Taping, Vasopneumatic device, Ionotophoresis 4mg /ml Dexamethasone, Manual therapy, and Re-evaluation  PLAN FOR NEXT SESSION: Discharge   Sanjuana Mae, PTA 04/11/2023, 11:32 AM

## 2023-04-14 ENCOUNTER — Ambulatory Visit: Payer: Medicare Other

## 2023-04-25 ENCOUNTER — Ambulatory Visit: Payer: Medicare Other | Admitting: Sports Medicine

## 2023-05-24 ENCOUNTER — Telehealth: Payer: Self-pay | Admitting: Physician Assistant

## 2023-05-24 NOTE — Telephone Encounter (Signed)
Patient's daughter Herbert Seta called about her mother she is concerned about her driving and would like to talk about taking away driving privileges She can be reached at [phone]

## 2023-06-03 ENCOUNTER — Encounter: Payer: Self-pay | Admitting: Physician Assistant

## 2023-06-03 ENCOUNTER — Ambulatory Visit (INDEPENDENT_AMBULATORY_CARE_PROVIDER_SITE_OTHER): Payer: Medicare Other | Admitting: Physician Assistant

## 2023-06-03 VITALS — BP 199/69 | HR 82 | Ht 63.0 in | Wt 138.0 lb

## 2023-06-03 DIAGNOSIS — G3184 Mild cognitive impairment, so stated: Secondary | ICD-10-CM

## 2023-06-03 DIAGNOSIS — R4189 Other symptoms and signs involving cognitive functions and awareness: Secondary | ICD-10-CM | POA: Diagnosis not present

## 2023-06-03 DIAGNOSIS — F03B0 Unspecified dementia, moderate, without behavioral disturbance, psychotic disturbance, mood disturbance, and anxiety: Secondary | ICD-10-CM

## 2023-06-03 DIAGNOSIS — F330 Major depressive disorder, recurrent, mild: Secondary | ICD-10-CM

## 2023-06-03 DIAGNOSIS — I10 Essential (primary) hypertension: Secondary | ICD-10-CM | POA: Diagnosis not present

## 2023-06-03 MED ORDER — MEMANTINE HCL 5 MG PO TABS: ORAL_TABLET | ORAL | 2 refills | Status: DC

## 2023-06-03 MED ORDER — LOSARTAN POTASSIUM-HCTZ 100-25 MG PO TABS
1.0000 | ORAL_TABLET | Freq: Every day | ORAL | 1 refills | Status: DC
Start: 2023-06-03 — End: 2024-01-06

## 2023-06-03 MED ORDER — VORTIOXETINE HBR 5 MG PO TABS: 5.0000 mg | ORAL_TABLET | Freq: Every day | ORAL | 1 refills | Status: DC

## 2023-06-03 MED ORDER — DONEPEZIL HCL 5 MG PO TABS
ORAL_TABLET | ORAL | 1 refills | Status: DC
Start: 2023-06-03 — End: 2024-02-22

## 2023-06-03 MED ORDER — AMLODIPINE BESYLATE 10 MG PO TABS
10.0000 mg | ORAL_TABLET | Freq: Every day | ORAL | 1 refills | Status: DC
Start: 2023-06-03 — End: 2023-09-14

## 2023-06-03 NOTE — Progress Notes (Signed)
Established Patient Office Visit  Subjective   Patient ID: Shirley James, female    DOB: 05-Sep-1941  Age: 82 y.o. MRN: [id number]  Chief Complaint  Patient presents with   Memory Loss    HPI Pt is a 82 yo female with dementia and HTN who presents to the clinic with daughter and caregiver for follow up.   They are concerned about her driving and would like to discuss that along with any other medications for her memory. Her short term memory is not good at all. She can still remember people, names and long term goals but cannot remember her current goal or task. With driving she can get easily frustrated and forget simple safety measures while driving. She is on aricept and family wonders about other medication. She lives with her husband and has a caregiver support care with her throughout the day.   Pt is not sure if she has taken her medication today for BP today or not. She is not checking BP at home. Pt denies any CP, palpitations, headaches or vision changes.    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 pylori infection 03/17/2016   Past Medical History:  Diagnosis Date   Hypertension      ROS See HPI.    Objective:     BP (!) 199/69   Pulse 82   Ht 5\' 3"  (1.6 m)   Wt 138 lb (62.6 kg)   SpO2 98%   BMI 24.45 kg/m  BP Readings from Last 3 Encounters:  06/03/23 (!) 199/69  10/08/22 (!) 138/57  08/06/22 (!) 157/57   Wt Readings from Last 3 Encounters:  06/03/23 138 lb (62.6 kg)  03/02/23 125 lb (56.7 kg)  08/06/22 136 lb (61.7 kg)    .    10/08/2022   10:34 AM 12/29/2020    9:11 AM 12/24/2020    9:24 AM 12/04/2018   12:03 PM 07/11/2017   10:25 AM  Depression screen PHQ 2/9  Decreased Interest 0 0 0 0 0  Down, Depressed, Hopeless 0 0 0 0 0  PHQ - 2 Score 0 0 0 0 0  Altered sleeping  0   0  Tired, decreased energy  0   0  Change in appetite  0   0  Feeling bad or failure about yourself   0   0  Trouble concentrating  0  0  Moving slowly or fidgety/restless  0   0  Suicidal thoughts  0   0  PHQ-9 Score  0   0          06/03/2023    3:47 PM 10/08/2022   11:24 AM  Montreal Cognitive Assessment   Visuospatial/ Executive (0/5) 5 3  Naming (0/3) 3 3  Attention: Read list of digits (0/2) 2 1  Attention: Read list of letters (0/1) 1 1  Attention: Serial 7 subtraction starting at 100 (0/3) 2 1  Language: Repeat phrase (0/2) 2 2  Language : Fluency (0/1) 1 0  Abstraction (0/2) 2 2  Delayed Recall (0/5) 0 0  Orientation (0/6) 0 2  Total 18 15  Adjusted Score (based on education) 18 15    Physical Exam Constitutional:      Appearance: Normal appearance.  HENT:     Head: Normocephalic.  Cardiovascular:     Rate and Rhythm: Normal rate.     Heart sounds: Murmur heard.  Pulmonary:     Effort: Pulmonary effort is  normal.     Breath sounds: Normal breath sounds.  Musculoskeletal:     Right lower leg: No edema.     Left lower leg: No edema.  Neurological:     Mental Status: She is alert.     Comments: Not confused but not able to answer place, time question. She did know me and why she was here.   Psychiatric:        Mood and Affect: Mood normal.        Assessment & Plan:  Marland KitchenMarland KitchenOlis was seen today for memory loss.  Diagnoses and all orders for this visit:  Moderate dementia without behavioral disturbance, psychotic disturbance, mood disturbance, or anxiety, unspecified dementia type (HCC) -     memantine (NAMENDA) 5 MG tablet; Take one tablet day for one week then increase to 2 tablets daily. -     donepezil (ARICEPT) 5 MG tablet; TAKE 1 TABLET BY MOUTH EVERYDAY AT BEDTIME -     Ambulatory referral to Neurology  Moderate cognitive impairment -     memantine (NAMENDA) 5 MG tablet; Take one tablet day for one week then increase to 2 tablets daily. -     COMPLETE METABOLIC PANEL WITH GFR -     donepezil (ARICEPT) 5 MG tablet; TAKE 1 TABLET BY MOUTH EVERYDAY AT BEDTIME  Essential hypertension, benign -     losartan-hydrochlorothiazide (HYZAAR) 100-25 MG tablet; Take 1 tablet by mouth daily. -     amLODipine (NORVASC) 10 MG tablet; Take 1 tablet (10 mg total) by mouth daily.  Amnestic MCI (mild cognitive impairment with memory loss) -     memantine (NAMENDA) 5 MG tablet; Take one tablet day for one week then increase to 2 tablets daily. -     donepezil (ARICEPT) 5 MG tablet; TAKE 1 TABLET BY MOUTH EVERYDAY AT BEDTIME  Uncontrolled hypertension -     COMPLETE METABOLIC PANEL WITH GFR  Mild episode of recurrent major depressive disorder (HCC)  Other orders -     Discontinue: vortioxetine HBr (TRINTELLIX) 5 MG TABS tablet; Take 1 tablet (5 mg total) by mouth daily.   Pt is very pleasant today  MOCA is actually a little better than in November Pt was not able to tolerate aricept 10mg  due  to diarrhea Decided to add namenda to aricept Referral made for neurology to discuss other medication options as desired Discussed with patient and family  no more driving alone EVER and very limited driving with supervision short distances no more than 3-5 miles from home Family and patient in agreement  There is some concern about remembering to take medication BP was very elevated today, pt is asymptomatic Caregiver is going to start setting up her pill box weekly and checking her BP regularly at home and reporting to the clinic If BP is not under 140/90 we will increase and change up medication CMP ordered today Nurse visit recheck in 2 weeks Follow up in office in 3 months  Spent 60 minutes with patient and family, reviewing chart, discussing plan and coordinating care.   Tandy Gaw, PA-C

## 2023-06-03 NOTE — Patient Instructions (Signed)
No more driving at all alone.  Very limited supervised driving Added namenda to aricept Get labs today

## 2023-06-04 LAB — COMPLETE METABOLIC PANEL WITH GFR
AG Ratio: 1.8 (calc) (ref 1.0–2.5)
ALT: 25 U/L (ref 6–29)
AST: 17 U/L (ref 10–35)
Albumin: 4.1 g/dL (ref 3.6–5.1)
Alkaline phosphatase (APISO): 66 U/L (ref 37–153)
BUN/Creatinine Ratio: 31 (calc) — ABNORMAL HIGH (ref 6–22)
BUN: 33 mg/dL — ABNORMAL HIGH (ref 7–25)
CO2: 29 mmol/L (ref 20–32)
Calcium: 9.5 mg/dL (ref 8.6–10.4)
Chloride: 101 mmol/L (ref 98–110)
Creat: 1.08 mg/dL — ABNORMAL HIGH (ref 0.60–0.95)
Globulin: 2.3 g/dL (calc) (ref 1.9–3.7)
Glucose, Bld: 93 mg/dL (ref 65–99)
Potassium: 4.4 mmol/L (ref 3.5–5.3)
Sodium: 139 mmol/L (ref 135–146)
Total Bilirubin: 0.4 mg/dL (ref 0.2–1.2)
Total Protein: 6.4 g/dL (ref 6.1–8.1)
eGFR: 52 mL/min/{1.73_m2} — ABNORMAL LOW (ref >=60)

## 2023-06-06 ENCOUNTER — Encounter: Payer: Self-pay | Admitting: Physician Assistant

## 2023-06-06 DIAGNOSIS — F03B0 Unspecified dementia, moderate, without behavioral disturbance, psychotic disturbance, mood disturbance, and anxiety: Secondary | ICD-10-CM | POA: Insufficient documentation

## 2023-06-06 NOTE — Progress Notes (Signed)
Kidneys are really dry. How much are you drinking a day? Increase hydration. Avoid any anti-inflammatories like ibuprofen and recheck in 2-3 weeks. Kidney function is down.

## 2023-06-07 ENCOUNTER — Other Ambulatory Visit: Payer: Self-pay

## 2023-06-07 DIAGNOSIS — R899 Unspecified abnormal finding in specimens from other organs, systems and tissues: Secondary | ICD-10-CM

## 2023-06-17 ENCOUNTER — Ambulatory Visit (INDEPENDENT_AMBULATORY_CARE_PROVIDER_SITE_OTHER): Payer: Medicare Other | Admitting: Physician Assistant

## 2023-06-17 VITALS — BP 132/61 | HR 67 | Ht 63.0 in

## 2023-06-17 DIAGNOSIS — I10 Essential (primary) hypertension: Secondary | ICD-10-CM | POA: Diagnosis not present

## 2023-06-17 NOTE — Progress Notes (Signed)
Established Patient Office Visit  Subjective   Patient ID: Shirley James, female    DOB: [birth year]  Age: 82 y.o. MRN: [id number]  Chief Complaint  Patient presents with   Hypertension    BP check - nurse visit.     HPI  Hypertension. BP check nurse visit.  Patient denies chest pain, shortness of breath, palpitations, dizziness or medication problems.   ROS    Objective:     BP 132/61   Pulse 67   Ht 5\' 3"  (1.6 m)   SpO2 97%   BMI 24.45 kg/m    Physical Exam   No results found for any visits on 06/17/23.    The ASCVD Risk score (Arnett DK, et al., 2019) failed to calculate for the following reasons:   The 2019 ASCVD risk score is only valid for ages 26 to 68    Assessment & Plan:  BP check nurse visit.  Initial reading = 163/62.second reading = 132/61. Per caregiver Tabatha patient has missed some of her BP doses - takes them inconsistently. Per Tandy Gaw, PA recommends keeping medication in a  daily pill dispenser and having husband check behind that the medication was taken daily.  Spoke with Herbert Seta  who will work on making this happen. Patient should return at regularly scheduled visit on 09/07/2023.  Problem List Items Addressed This Visit   None Visit Diagnoses     Essential hypertension, benign    -  Primary       Return for Return on 09/07/23 for follow up visit with Tandy Gaw, PA.    Elizabeth Palau, LPN

## 2023-06-17 NOTE — Progress Notes (Signed)
Agree with below plan. BP on 2nd recheck was good. Strongly encourage husband and caregiver to take more active role in making sure she takes her medication.

## 2023-06-17 NOTE — Patient Instructions (Signed)
Return on 09/07/23 for follow up visit with Tandy Gaw, PA

## 2023-06-25 ENCOUNTER — Other Ambulatory Visit: Payer: Self-pay | Admitting: Physician Assistant

## 2023-06-25 DIAGNOSIS — G3184 Mild cognitive impairment, so stated: Secondary | ICD-10-CM

## 2023-06-25 DIAGNOSIS — R4189 Other symptoms and signs involving cognitive functions and awareness: Secondary | ICD-10-CM

## 2023-06-25 DIAGNOSIS — F03B0 Unspecified dementia, moderate, without behavioral disturbance, psychotic disturbance, mood disturbance, and anxiety: Secondary | ICD-10-CM

## 2023-06-29 ENCOUNTER — Encounter: Payer: Self-pay | Admitting: Physician Assistant

## 2023-07-01 NOTE — Telephone Encounter (Signed)
Medical Release Form from [facility] to another office has been sent to the email below on 07/01/23 at 4:06 pm.

## 2023-07-20 ENCOUNTER — Other Ambulatory Visit: Payer: Self-pay | Admitting: Physician Assistant

## 2023-07-24 ENCOUNTER — Other Ambulatory Visit: Payer: Self-pay | Admitting: Sports Medicine

## 2023-07-24 DIAGNOSIS — M19012 Primary osteoarthritis, left shoulder: Secondary | ICD-10-CM

## 2023-09-07 ENCOUNTER — Ambulatory Visit (INDEPENDENT_AMBULATORY_CARE_PROVIDER_SITE_OTHER): Payer: Medicare Other | Admitting: Physician Assistant

## 2023-09-07 ENCOUNTER — Encounter: Payer: Self-pay | Admitting: Physician Assistant

## 2023-09-07 VITALS — BP 142/79 | HR 85 | Ht 63.0 in | Wt 131.0 lb

## 2023-09-07 DIAGNOSIS — I6523 Occlusion and stenosis of bilateral carotid arteries: Secondary | ICD-10-CM

## 2023-09-07 DIAGNOSIS — R944 Abnormal results of kidney function studies: Secondary | ICD-10-CM | POA: Insufficient documentation

## 2023-09-07 DIAGNOSIS — I351 Nonrheumatic aortic (valve) insufficiency: Secondary | ICD-10-CM | POA: Insufficient documentation

## 2023-09-07 DIAGNOSIS — R011 Cardiac murmur, unspecified: Secondary | ICD-10-CM | POA: Diagnosis not present

## 2023-09-07 DIAGNOSIS — F03B0 Unspecified dementia, moderate, without behavioral disturbance, psychotic disturbance, mood disturbance, and anxiety: Secondary | ICD-10-CM

## 2023-09-07 DIAGNOSIS — I10 Essential (primary) hypertension: Secondary | ICD-10-CM | POA: Diagnosis not present

## 2023-09-07 DIAGNOSIS — E7800 Pure hypercholesterolemia, unspecified: Secondary | ICD-10-CM | POA: Diagnosis not present

## 2023-09-07 DIAGNOSIS — Z23 Encounter for immunization: Secondary | ICD-10-CM

## 2023-09-07 MED ORDER — ATORVASTATIN CALCIUM 40 MG PO TABS
40.0000 mg | ORAL_TABLET | Freq: Every day | ORAL | 3 refills | Status: DC
Start: 2023-09-07 — End: 2024-08-10

## 2023-09-07 NOTE — Progress Notes (Signed)
Established Patient Office Visit  Subjective   Patient ID: Shirley James, female    DOB: June 27, 1941  Age: 82 y.o. MRN: [id number]  No chief complaint on file.   HPI Pt is a 82 y.o. female with dementia and has a caregiver with her today. She is here to follow up on decreased GFR on last labs. She admits she is not drinking a lot of fluids during the day. She denies any mood concerns. She denies any CP, palpitations, headaches or vision concerns. She is active. She went to the beach last week with family. She is taking her medication and her caregiver helps her remember. She states her BP is taken at home a lot and under 130/80.   .. 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/2020   Low serum vitamin B12 06/23/2021   Elevated LDL cholesterol level 06/23/2021   Carotid atherosclerosis, bilateral 07/08/2021   Mild episode of recurrent major depressive disorder (HCC) 10/08/2022   Irritability 10/08/2022   Acute pain of left shoulder 03/02/2023   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 pylori infection  03/17/2016   Past Medical History:  Diagnosis Date   Hypertension      ROS   See HPI.  Objective:     BP (!) 142/79   Pulse 85   Ht 5\' 3"  (1.6 m)   Wt 131 lb (59.4 kg)   SpO2 98%   BMI 23.21 kg/m  BP Readings from Last 3 Encounters:  09/07/23 (!) 142/79  06/17/23 132/61  06/03/23 (!) 199/69   Wt Readings from Last 3 Encounters:  09/07/23 131 lb (59.4 kg)  06/03/23 138 lb (62.6 kg)  03/02/23 125 lb (56.7 kg)    ..    09/07/2023    1:55 PM 10/08/2022   10:34 AM 12/29/2020    9:11 AM 12/24/2020    9:24 AM 12/04/2018   12:03 PM  Depression screen PHQ 2/9  Decreased Interest 0 0 0 0 0  Down, Depressed, Hopeless 0 0 0 0 0  PHQ - 2 Score 0 0 0 0 0  Altered sleeping   0    Tired, decreased energy   0    Change in appetite   0    Feeling bad or failure about yourself    0    Trouble concentrating   0    Moving slowly or fidgety/restless   0    Suicidal thoughts   0    PHQ-9 Score   0       Physical Exam Constitutional:      Appearance: Normal appearance.  HENT:     Head:  Normocephalic.  Cardiovascular:     Rate and Rhythm: Normal rate and regular rhythm.     Heart sounds: Murmur heard.  Pulmonary:     Effort: Pulmonary effort is normal.     Breath sounds: Normal breath sounds.  Musculoskeletal:     Right lower leg: No edema.     Left lower leg: No edema.  Neurological:     General: No focal deficit present.     Mental Status: She is alert and oriented to person, place, and time.  Psychiatric:        Mood and Affect: Mood normal.        Assessment & Plan:  Marland KitchenMarland KitchenDiagnoses and all orders for this visit:  Primary hypertension  Decreased GFR -     CMP14+EGFR  Moderate dementia without behavioral disturbance, psychotic disturbance, mood disturbance, or anxiety, unspecified dementia type (HCC)  Encounter for immunization -     Flu Vaccine Trivalent High Dose (Fluad)  Heart murmur, systolic  Elevated LDL cholesterol level -     atorvastatin  (LIPITOR) 40 MG tablet; Take 1 tablet (40 mg total) by mouth daily.  Carotid atherosclerosis, bilateral -     atorvastatin (LIPITOR) 40 MG tablet; Take 1 tablet (40 mg total) by mouth daily.   BP better on 2nd recheck but not to goal Pt's home readings are better and under 130/80 Will check cmp today Flu shot given today Continue on same medications for mood and memory   Return in about 6 months (around 03/07/2024).    Tandy Gaw, PA-C

## 2023-09-08 LAB — CMP14+EGFR
ALT: 12 [IU]/L (ref 0–32)
AST: 19 [IU]/L (ref 0–40)
Albumin: 4.6 g/dL (ref 3.7–4.7)
Alkaline Phosphatase: 67 [IU]/L (ref 44–121)
BUN/Creatinine Ratio: 15 (ref 12–28)
BUN: 12 mg/dL (ref 8–27)
Bilirubin Total: 0.4 mg/dL (ref 0.0–1.2)
CO2: 27 mmol/L (ref 20–29)
Calcium: 9.2 mg/dL (ref 8.7–10.3)
Chloride: 101 mmol/L (ref 96–106)
Creatinine, Ser: 0.81 mg/dL (ref 0.57–1.00)
Globulin, Total: 2.4 g/dL (ref 1.5–4.5)
Glucose: 89 mg/dL (ref 70–99)
Potassium: 3.6 mmol/L (ref 3.5–5.2)
Sodium: 142 mmol/L (ref 134–144)
Total Protein: 7 g/dL (ref 6.0–8.5)
eGFR: 73 mL/min/{1.73_m2} (ref >59)

## 2023-09-09 NOTE — Progress Notes (Signed)
Shirley James,   Kidney function is MUCH better!  Labs look great.

## 2023-09-11 ENCOUNTER — Encounter: Payer: Self-pay | Admitting: Physician Assistant

## 2023-09-12 ENCOUNTER — Other Ambulatory Visit: Payer: Self-pay | Admitting: Physician Assistant

## 2023-09-12 DIAGNOSIS — I10 Essential (primary) hypertension: Secondary | ICD-10-CM

## 2023-12-07 ENCOUNTER — Other Ambulatory Visit: Payer: Self-pay | Admitting: Sports Medicine

## 2023-12-07 DIAGNOSIS — M19012 Primary osteoarthritis, left shoulder: Secondary | ICD-10-CM

## 2023-12-28 ENCOUNTER — Telehealth: Payer: Self-pay

## 2023-12-28 NOTE — Telephone Encounter (Signed)
 Concerns are in separate message sent earlier today.

## 2023-12-28 NOTE — Telephone Encounter (Signed)
Copied from CRM [id number]. Topic: Clinical - Medical Advice >> Dec 28, 2023  1:53 PM Shelah Lewandowsky wrote: Reason for CRM: Daughter Trudie Buckler calling again, she has not had a call back from Monday when she called about concerns, please call [phone]

## 2023-12-28 NOTE — Telephone Encounter (Signed)
 Copied from CRM [id number]. Topic: Clinical - Medical Advice >> Dec 26, 2023 10:54 AM Joesph PARAS wrote: Reason for CRM: Patient is not taking medications as needed. Patient is only taking in the morning and cannot recall to take them at night. Patient seems to be forgetting who is home and who is not. Patient appears to be searching for parents (deceased). Patient also appears to be forgetting how to swallow, as she has nearly choked a few times over the weekend. Patient's daughter requesting guidance, either via phone call or via appointment if needed.   Patient is also concerned they take two tylenol  PM each and every single night. Patient does not think this is healthy and is seeking advice. C/B on file ending in 2659.

## 2023-12-30 NOTE — Telephone Encounter (Signed)
 Ms. Rodolph Clap is calling back again.

## 2023-12-30 NOTE — Telephone Encounter (Signed)
 Attempted call to patient daughter Josephine Nicolas- left a voice mail message requesting a return call.

## 2023-12-30 NOTE — Telephone Encounter (Signed)
 Patient's daughter heidi informed of need for  family visit appt - patient daughter is wanting to know what can be done as she does not want to speak about her concerns in front of her mother. I told her that patient would have to be available in some way for insurance coverage and for provider's evaluation . She understands but wants to know how could speak without her mother hearing her concerns. She states she does not feel she needs care while she is sleeping but only while awake . Requesting a call back with Vermell Bologna suggestions before scheduling appt  return call # [phone] -o.k. to leave detailed voice mail message on this number per [name].

## 2023-12-31 ENCOUNTER — Other Ambulatory Visit: Payer: Self-pay | Admitting: Physician Assistant

## 2023-12-31 DIAGNOSIS — R4189 Other symptoms and signs involving cognitive functions and awareness: Secondary | ICD-10-CM

## 2023-12-31 DIAGNOSIS — F03B0 Unspecified dementia, moderate, without behavioral disturbance, psychotic disturbance, mood disturbance, and anxiety: Secondary | ICD-10-CM

## 2023-12-31 DIAGNOSIS — G3184 Mild cognitive impairment, so stated: Secondary | ICD-10-CM

## 2024-01-02 NOTE — Telephone Encounter (Signed)
 Spoke with sister Pattie Borders  ( on HIPAA) she will discuss with [name] and will call back to schedule both family phone visit and visit in office with patient as well.

## 2024-01-06 ENCOUNTER — Other Ambulatory Visit: Payer: Self-pay | Admitting: Physician Assistant

## 2024-01-06 DIAGNOSIS — I10 Essential (primary) hypertension: Secondary | ICD-10-CM

## 2024-02-18 ENCOUNTER — Other Ambulatory Visit: Payer: Self-pay | Admitting: Physician Assistant

## 2024-02-18 DIAGNOSIS — F03B0 Unspecified dementia, moderate, without behavioral disturbance, psychotic disturbance, mood disturbance, and anxiety: Secondary | ICD-10-CM

## 2024-02-18 DIAGNOSIS — G3184 Mild cognitive impairment, so stated: Secondary | ICD-10-CM

## 2024-02-18 DIAGNOSIS — R4189 Other symptoms and signs involving cognitive functions and awareness: Secondary | ICD-10-CM

## 2024-02-28 ENCOUNTER — Telehealth: Payer: Self-pay

## 2024-02-28 NOTE — Telephone Encounter (Signed)
 Copied from CRM [id number]. Topic: Clinical - Medical Advice >> Dec 30, 2023 11:32 AM Higinio Roger wrote: Reason for CRM: Patient's daughter Trudie Buckler) has called several times and would like a callback from Dr. Caleen Essex to speak about her mother's health. [name] states that patient is having bad memory, trouble swallowing and begins to choke. [name]'s phone number is [phone].

## 2024-03-01 DIAGNOSIS — I10 Essential (primary) hypertension: Secondary | ICD-10-CM | POA: Diagnosis not present

## 2024-03-01 DIAGNOSIS — G3184 Mild cognitive impairment, so stated: Secondary | ICD-10-CM | POA: Diagnosis not present

## 2024-03-01 DIAGNOSIS — E538 Deficiency of other specified B group vitamins: Secondary | ICD-10-CM | POA: Diagnosis not present

## 2024-03-01 DIAGNOSIS — E559 Vitamin D deficiency, unspecified: Secondary | ICD-10-CM | POA: Diagnosis not present

## 2024-03-07 ENCOUNTER — Ambulatory Visit: Payer: Medicare Other | Admitting: Physician Assistant

## 2024-03-16 DIAGNOSIS — G3184 Mild cognitive impairment, so stated: Secondary | ICD-10-CM | POA: Diagnosis not present

## 2024-03-16 DIAGNOSIS — E538 Deficiency of other specified B group vitamins: Secondary | ICD-10-CM | POA: Diagnosis not present

## 2024-03-16 DIAGNOSIS — E559 Vitamin D deficiency, unspecified: Secondary | ICD-10-CM | POA: Diagnosis not present

## 2024-03-16 DIAGNOSIS — I10 Essential (primary) hypertension: Secondary | ICD-10-CM | POA: Diagnosis not present

## 2024-03-27 NOTE — Telephone Encounter (Signed)
 Spoke with patient daughter Schuyler Custard she got frustrated waiting to hear form someone in our office  Patient has been seeing a geriatric specialist , Dr. 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 Amil Balding as part of a study a few years back and is very happy with her care.  Asked if she was transferring care to Dr. Amil Balding or just collaboration of care  She states she is going to speak with Dr. Amil Balding at her mother's upcoming appt in June and will reach out to let us  know.

## 2024-03-28 NOTE — Telephone Encounter (Signed)
 Patient daughter heidi informed and agrees to keep seeing Dr. Amil Balding.

## 2024-04-08 ENCOUNTER — Other Ambulatory Visit: Payer: Self-pay | Admitting: Sports Medicine

## 2024-04-08 DIAGNOSIS — M19012 Primary osteoarthritis, left shoulder: Secondary | ICD-10-CM

## 2024-05-01 DIAGNOSIS — R21 Rash and other nonspecific skin eruption: Secondary | ICD-10-CM | POA: Diagnosis not present

## 2024-05-17 ENCOUNTER — Other Ambulatory Visit: Payer: Self-pay | Admitting: Physician Assistant

## 2024-05-17 DIAGNOSIS — I10 Essential (primary) hypertension: Secondary | ICD-10-CM

## 2024-05-18 DIAGNOSIS — G3184 Mild cognitive impairment, so stated: Secondary | ICD-10-CM | POA: Diagnosis not present

## 2024-05-18 DIAGNOSIS — I10 Essential (primary) hypertension: Secondary | ICD-10-CM | POA: Diagnosis not present

## 2024-05-18 DIAGNOSIS — Z79899 Other long term (current) drug therapy: Secondary | ICD-10-CM | POA: Diagnosis not present

## 2024-05-18 DIAGNOSIS — E559 Vitamin D deficiency, unspecified: Secondary | ICD-10-CM | POA: Diagnosis not present

## 2024-05-18 DIAGNOSIS — E538 Deficiency of other specified B group vitamins: Secondary | ICD-10-CM | POA: Diagnosis not present

## 2024-07-24 ENCOUNTER — Encounter: Payer: Self-pay | Admitting: Sports Medicine

## 2024-07-25 DIAGNOSIS — I10 Essential (primary) hypertension: Secondary | ICD-10-CM | POA: Diagnosis not present

## 2024-07-25 DIAGNOSIS — G3184 Mild cognitive impairment, so stated: Secondary | ICD-10-CM | POA: Diagnosis not present

## 2024-07-25 DIAGNOSIS — E559 Vitamin D deficiency, unspecified: Secondary | ICD-10-CM | POA: Diagnosis not present

## 2024-07-25 DIAGNOSIS — E538 Deficiency of other specified B group vitamins: Secondary | ICD-10-CM | POA: Diagnosis not present

## 2024-07-25 DIAGNOSIS — Z79899 Other long term (current) drug therapy: Secondary | ICD-10-CM | POA: Diagnosis not present

## 2024-08-10 ENCOUNTER — Other Ambulatory Visit: Payer: Self-pay | Admitting: Physician Assistant

## 2024-08-10 DIAGNOSIS — I6523 Occlusion and stenosis of bilateral carotid arteries: Secondary | ICD-10-CM

## 2024-08-10 DIAGNOSIS — E7800 Pure hypercholesterolemia, unspecified: Secondary | ICD-10-CM

## 2024-08-17 DIAGNOSIS — F0280 Dementia in other diseases classified elsewhere without behavioral disturbance: Secondary | ICD-10-CM | POA: Diagnosis not present

## 2024-08-17 DIAGNOSIS — E538 Deficiency of other specified B group vitamins: Secondary | ICD-10-CM | POA: Diagnosis not present

## 2024-08-17 DIAGNOSIS — G3184 Mild cognitive impairment, so stated: Secondary | ICD-10-CM | POA: Diagnosis not present

## 2024-08-17 DIAGNOSIS — G301 Alzheimer's disease with late onset: Secondary | ICD-10-CM | POA: Diagnosis not present

## 2024-08-17 DIAGNOSIS — E559 Vitamin D deficiency, unspecified: Secondary | ICD-10-CM | POA: Diagnosis not present

## 2024-08-19 ENCOUNTER — Other Ambulatory Visit: Payer: Self-pay | Admitting: Physician Assistant

## 2024-08-19 DIAGNOSIS — I10 Essential (primary) hypertension: Secondary | ICD-10-CM

## 2024-08-27 ENCOUNTER — Other Ambulatory Visit: Payer: Self-pay | Admitting: Physician Assistant

## 2024-08-27 DIAGNOSIS — E7800 Pure hypercholesterolemia, unspecified: Secondary | ICD-10-CM

## 2024-08-27 DIAGNOSIS — I6523 Occlusion and stenosis of bilateral carotid arteries: Secondary | ICD-10-CM

## 2024-08-27 DIAGNOSIS — I10 Essential (primary) hypertension: Secondary | ICD-10-CM

## 2024-08-27 NOTE — Telephone Encounter (Unsigned)
 Copied from CRM [id number]. Topic: Clinical - Medication Refill >> Aug 27, 2024  9:41 AM Donee H wrote: Medication: amLODipine  (NORVASC ) 10 MG tablet atorvastatin  (LIPITOR) 40 MG tablet losartan -hydrochlorothiazide  (HYZAAR) 100-25 MG tablet    Has the patient contacted their pharmacy? Yes, Ronnald from [facility] called on behalf of patient   This is the patient's preferred pharmacy:  [facility] - [place], [place] - [address] [place] [address] Phone: [phone] Fax: [phone]    Is this the correct pharmacy for this prescription? Yes  Has the prescription been filled recently? No  Is the patient out of the medication? Pharmacy stated unsure if patient is completely out of medication   Has the patient been seen for an appointment in the last year OR does the patient have an upcoming appointment? Yes  Can we respond through MyChart? Yes  Agent: Please be advised that Rx refills may take up to 3 business days. We ask that you follow-up with your pharmacy.

## 2024-11-14 ENCOUNTER — Telehealth: Payer: Self-pay

## 2024-11-14 NOTE — Progress Notes (Signed)
" ° °  11/14/2024  Patient ID: Shirley James, female   DOB: 1941-02-02, 83 y.o.   MRN: [id number]  This patient is appearing on a report for being at risk of failing the adherence measure for cholesterol (statin) medications this calendar year.   Medication: atorvastatin  40mg   Last fill date: 08/10/24 for 90 day supply  Patient is followed by Dr. Carmelita with internal medicine at [facility] now for primary care.  Shirley James, PharmD, DPLA   "

## 2024-12-17 ENCOUNTER — Other Ambulatory Visit: Payer: Self-pay | Admitting: Physician Assistant

## 2024-12-17 DIAGNOSIS — I10 Essential (primary) hypertension: Secondary | ICD-10-CM

## 2024-12-17 NOTE — Telephone Encounter (Signed)
 Please see previous note

## 2024-12-17 NOTE — Telephone Encounter (Signed)
 12/17/24-Left message on patients voicemail to contact the office to schedule follow-up appointment with Vermell Bologna, in order to obtain more refills.

## 2024-12-17 NOTE — Telephone Encounter (Signed)
 This patient doesn't have a pcp. She will need to est. Care for medication refills. Please call pt

## 2024-12-17 NOTE — Telephone Encounter (Signed)
 Pt seemed to be a [facility] patient up until 12/12/2024. Can we look into that when we contact patient? There is no letter stating [facility] is no longer providing care.

## 2024-12-18 NOTE — Telephone Encounter (Signed)
 Patient transferred her care.
